# Patient Record
Sex: Male | Born: 2000 | Race: Black or African American | Hispanic: No | Marital: Single | Smoking: Never smoker
Health system: Southern US, Community
[De-identification: ages and names within clinical notes are randomized; demographics above are authoritative.]

## PROBLEM LIST (undated history)

## (undated) DIAGNOSIS — R569 Unspecified convulsions: Secondary | ICD-10-CM

## (undated) HISTORY — PX: ABDOMINAL SURGERY: SHX537

---
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
## 2018-05-15 ENCOUNTER — Encounter (HOSPITAL_BASED_OUTPATIENT_CLINIC_OR_DEPARTMENT_OTHER): Payer: Self-pay | Admitting: Emergency Medicine

## 2018-05-15 ENCOUNTER — Emergency Department (HOSPITAL_BASED_OUTPATIENT_CLINIC_OR_DEPARTMENT_OTHER)
Admission: EM | Admit: 2018-05-15 | Discharge: 2018-05-15 | Disposition: A | Discharge status: Home / Self Care | Payer: Self-pay | Attending: Emergency Medicine | Admitting: Emergency Medicine

## 2018-05-15 ENCOUNTER — Other Ambulatory Visit: Payer: Self-pay

## 2018-05-15 DIAGNOSIS — M79662 Pain in left lower leg: Secondary | ICD-10-CM | POA: Insufficient documentation

## 2018-05-15 DIAGNOSIS — M79605 Pain in left leg: Secondary | ICD-10-CM

## 2018-05-15 LAB — BASIC METABOLIC PANEL
Anion gap: 7 (ref 5–15)
BUN: 20 mg/dL (ref 6–20)
CHLORIDE: 106 mmol/L (ref 98–111)
CO2: 25 mmol/L (ref 22–32)
Calcium: 8.9 mg/dL (ref 8.9–10.3)
Creatinine, Ser: 0.93 mg/dL (ref 0.61–1.24)
GFR calc Af Amer: >60 mL/min (ref >60)
GFR calc non Af Amer: >60 mL/min (ref >60)
Glucose, Bld: 121 mg/dL — ABNORMAL HIGH (ref 70–99)
POTASSIUM: 4.1 mmol/L (ref 3.5–5.1)
SODIUM: 138 mmol/L (ref 135–145)

## 2018-05-15 LAB — CBC WITH DIFFERENTIAL/PLATELET
ABS IMMATURE GRANULOCYTES: 0.01 10*3/uL (ref 0.00–0.07)
Basophils Absolute: 0 10*3/uL (ref 0.0–0.1)
Basophils Relative: 0 %
Eosinophils Absolute: 0.2 10*3/uL (ref 0.0–0.5)
Eosinophils Relative: 5 %
HCT: 47.7 % (ref 39.0–52.0)
HEMOGLOBIN: 15.3 g/dL (ref 13.0–17.0)
Immature Granulocytes: 0 %
LYMPHS ABS: 2 10*3/uL (ref 0.7–4.0)
LYMPHS PCT: 44 %
MCH: 28.9 pg (ref 26.0–34.0)
MCHC: 32.1 g/dL (ref 30.0–36.0)
MCV: 90 fL (ref 80.0–100.0)
MONO ABS: 0.4 10*3/uL (ref 0.1–1.0)
Monocytes Relative: 8 %
NEUTROS ABS: 2 10*3/uL (ref 1.7–7.7)
Neutrophils Relative %: 43 %
Platelets: 250 10*3/uL (ref 150–400)
RBC: 5.3 MIL/uL (ref 4.22–5.81)
RDW: 14.2 % (ref 11.5–15.5)
WBC: 4.7 10*3/uL (ref 4.0–10.5)
nRBC: 0 % (ref 0.0–0.2)

## 2018-05-15 LAB — D-DIMER, QUANTITATIVE (NOT AT ARMC)

## 2018-05-15 MED ORDER — ACETAMINOPHEN 325 MG PO TABS
650.0000 mg | ORAL_TABLET | Freq: Once | ORAL | Status: AC
  Administered 2018-05-15: 650 mg via ORAL
  Filled 2018-05-15: qty 2

## 2018-05-15 NOTE — ED Notes (Signed)
Signature pad in room not working. Pt provided verbal understanding of discharge instructions with teach back.  

## 2018-05-15 NOTE — Discharge Instructions (Signed)
Report back to this ED tomorrow at 1 PM for your appointment for an ultrasound of your leg. You can take Tylenol before hours as needed for pain.

## 2018-05-15 NOTE — ED Notes (Signed)
Left lower leg pain  States woke w knot to back of leg  Denies inj

## 2018-05-15 NOTE — ED Triage Notes (Signed)
Patient states that he woke up this am with a "knot" in his left leg - patient states that it is still painful

## 2018-05-15 NOTE — ED Provider Notes (Signed)
[facility] EMERGENCY DEPARTMENT Provider Note   CSN: [id number] Arrival date & time: 05/15/18  1537     History   Chief Complaint Chief Complaint  Patient presents with  . Leg Pain    HPI Thomas Roy is a 17 y.o. male without significant past medical history, presenting to the emergency department with acute onset of left calf pain began upon awakening this morning.  He states he has a "knot" there that has gotten slightly smaller, however pain is still persists.  Pain is in the calf, worse with walking.  Applied heat and took some Tylenol without much relief.  Denies history of blood clot.  No recent prolonged travel, surgery, trauma.  No known injuries.  No shortness of breath.  The history is provided by the patient.    History reviewed. No pertinent past medical history.  There are no active problems to display for this patient.   Past Surgical History:  Procedure Laterality Date  . ABDOMINAL SURGERY          Home Medications    Prior to Admission medications   Not on File    Family History History reviewed. No pertinent family history.  Social History Social History   Tobacco Use  . Smoking status: Never Smoker  . Smokeless tobacco: Never Used  Substance Use Topics  . Alcohol use: No  . Drug use: No     Allergies   Patient has no known allergies.   Review of Systems Review of Systems  Musculoskeletal: Positive for myalgias.  Skin: Negative for color change and wound.  All other systems reviewed and are negative.    Physical Exam Updated Vital Signs BP 129/75 (BP Location: Left Arm)   Pulse 62   Temp 99 F (37.2 C) (Oral)   Resp 18   Ht 6\' 4"  (1.93 m)   Wt 104.3 kg   SpO2 100%   BMI 27.99 kg/m   Physical Exam  Constitutional: He appears well-developed and well-nourished. No distress.  HENT:  Head: Normocephalic and atraumatic.  Eyes: Conjunctivae are normal.  Cardiovascular: Normal rate and intact distal pulses.   Pulmonary/Chest: Effort normal.  Musculoskeletal:  Left calf feels firm compared to the right.  There is some tenderness to the middle of the calf.  No obvious palpable cord.  No redness or warmth.  Antalgic gait favoring the left.  Positive Homans sign.  Normal range of motion of surrounding joints.  Psychiatric: He has a normal mood and affect. His behavior is normal.  Nursing note and vitals reviewed.    ED Treatments / Results  Labs (all labs ordered are listed, but only abnormal results are displayed) Labs Reviewed  BASIC METABOLIC PANEL - Abnormal; Notable for the following components:      Result Value   Glucose, Bld 121 (*)    All other components within normal limits  CBC WITH DIFFERENTIAL/PLATELET  D-DIMER, QUANTITATIVE (NOT AT [facility])    EKG None  Radiology No results found.  Procedures Procedures (including critical care time)  Medications Ordered in ED Medications  acetaminophen (TYLENOL) tablet 650 mg (650 mg Oral Given 05/15/18 1659)     Initial Impression / Assessment and Plan / ED Course  I have reviewed the triage vital signs and the nursing notes.  Pertinent labs & imaging results that were available during my care of the patient were reviewed by me and considered in my medical decision making (see chart for details).     Patient presenting  with acute onset of left calf pain that began this morning upon awakening.  No particular injury.  Pain with walking.  No risk factors for DVT.  No respiratory symptoms.  Vital signs are normal.  On exam, he has a positive Homans sign with mid calf tenderness.  Calf does feel firm to the touch compared the left.  Antalgic gait favoring the left.  Neurovascularly intact.  Vascular ultrasound is not currently available at this time to rule out DVT.  For this reason, labs obtained, d-dimer is negative.  Patient will be discharged with appointment tomorrow afternoon at 1 PM for ultrasound to rule out DVT.  Agreeable to this  plan and safe for discharge.  Patient discussed with Dr. Fredderick Phenix.  Discussed results, findings, treatment and follow up. Patient advised of return precautions. Patient verbalized understanding and agreed with plan.  Final Clinical Impressions(s) / ED Diagnoses   Final diagnoses:  Pain of left calf    ED Discharge Orders         Ordered    US Venous Img Lower Unilateral Left     05/15/18 1748           Letrell Attwood, [place] N, PA-C 05/15/18 1759    Rolan Bucco, MD 05/15/18 [phone]

## 2018-05-16 ENCOUNTER — Inpatient Hospital Stay (HOSPITAL_BASED_OUTPATIENT_CLINIC_OR_DEPARTMENT_OTHER): Admit: 2018-05-16 | Payer: Self-pay

## 2018-05-17 ENCOUNTER — Ambulatory Visit (HOSPITAL_BASED_OUTPATIENT_CLINIC_OR_DEPARTMENT_OTHER): Admission: RE | Admit: 2018-05-17 | Payer: Self-pay | Source: Ambulatory Visit

## 2018-07-18 ENCOUNTER — Other Ambulatory Visit: Payer: Self-pay

## 2018-07-18 ENCOUNTER — Emergency Department (HOSPITAL_BASED_OUTPATIENT_CLINIC_OR_DEPARTMENT_OTHER)
Admission: EM | Admit: 2018-07-18 | Discharge: 2018-07-18 | Disposition: A | Discharge status: Home / Self Care | Payer: Self-pay | Attending: Emergency Medicine | Admitting: Emergency Medicine

## 2018-07-18 ENCOUNTER — Encounter (HOSPITAL_BASED_OUTPATIENT_CLINIC_OR_DEPARTMENT_OTHER): Payer: Self-pay | Admitting: Emergency Medicine

## 2018-07-18 DIAGNOSIS — B351 Tinea unguium: Secondary | ICD-10-CM | POA: Insufficient documentation

## 2018-07-18 NOTE — ED Triage Notes (Signed)
Patient states that he his left big toe toenail is " lifted up" -

## 2018-07-18 NOTE — Discharge Instructions (Signed)
Please follow-up with the podiatrist for further evaluation and treatment of your fungal nail infection.  You may need an oral medication that requires your blood levels to be monitored.  Keep your nail attached as long as possible to protect the new nail that is growing underneath.  Please return to the emergency department you develop any pain, redness, swelling, red streaking from the area.

## 2018-07-18 NOTE — ED Provider Notes (Signed)
[facility] EMERGENCY DEPARTMENT Provider Note   CSN: [id number] Arrival date & time: 07/18/18  1641     History   Chief Complaint Chief Complaint  Patient presents with  . Nail Problem    HPI Thomas Roy is a 18 y.o. male who presents with concern that his left toenail is lifting up.  He reports he noticed it in the shower today.  He denies any trauma.  He denies any significant pain unless he messes with it.  He denies having problems with the nail before.  No interventions taken prior to arrival.  HPI  History reviewed. No pertinent past medical history.  There are no active problems to display for this patient.   Past Surgical History:  Procedure Laterality Date  . ABDOMINAL SURGERY          Home Medications    Prior to Admission medications   Not on File    Family History History reviewed. No pertinent family history.  Social History Social History   Tobacco Use  . Smoking status: Never Smoker  . Smokeless tobacco: Never Used  Substance Use Topics  . Alcohol use: No  . Drug use: No     Allergies   Patient has no known allergies.   Review of Systems Review of Systems  Constitutional: Negative for fever.  Skin: Negative for rash and wound.     Physical Exam Updated Vital Signs BP 123/78 (BP Location: Left Arm)   Pulse 88   Temp 98.5 F (36.9 C) (Oral)   Resp 18   Ht 6\' 4"  (1.93 m)   Wt 104.3 kg   SpO2 100%   BMI 27.99 kg/m   Physical Exam Vitals signs and nursing note reviewed.  Constitutional:      General: He is not in acute distress.    Appearance: He is well-developed. He is not diaphoretic.  HENT:     Head: Normocephalic and atraumatic.     Mouth/Throat:     Pharynx: No oropharyngeal exudate.  Eyes:     General: No scleral icterus.       Right eye: No discharge.        Left eye: No discharge.     Conjunctiva/sclera: Conjunctivae normal.     Pupils: Pupils are equal, round, and reactive to light.  Neck:       Musculoskeletal: Normal range of motion and neck supple.     Thyroid: No thyromegaly.  Cardiovascular:     Rate and Rhythm: Normal rate and regular rhythm.     Heart sounds: Normal heart sounds. No murmur. No friction rub. No gallop.   Pulmonary:     Effort: Pulmonary effort is normal. No respiratory distress.     Breath sounds: Normal breath sounds. No stridor. No wheezing or rales.  Abdominal:     General: Bowel sounds are normal. There is no distension.     Palpations: Abdomen is soft.     Tenderness: There is no abdominal tenderness. There is no guarding or rebound.  Lymphadenopathy:     Cervical: No cervical adenopathy.  Skin:    General: Skin is warm and dry.     Coloration: Skin is not pale.     Findings: No rash.     Comments: Left great toenail is lifting up on the lateral aspect and is still attached on the medial aspect; it is thick and fungal appearing; there is a new, also fungal appearing, toenail growing underneath, and there is no surrounding  erythema or drainage  Neurological:     Mental Status: He is alert.     Coordination: Coordination normal.      ED Treatments / Results  Labs (all labs ordered are listed, but only abnormal results are displayed) Labs Reviewed - No data to display  EKG None  Radiology No results found.  Procedures Procedures (including critical care time)  Medications Ordered in ED Medications - No data to display   Initial Impression / Assessment and Plan / ED Course  I have reviewed the triage vital signs and the nursing notes.  Pertinent labs & imaging results that were available during my care of the patient were reviewed by me and considered in my medical decision making (see chart for details).     Patient with onychomycosis.  He appears to have a new, also fungal toenail growing underneath. The fungal infection seems to be extended to the to the nail matrix and I do not feel an expensive, topical medication is  worthwhile at this point.  I do not feel comfortable initiating terbinafine without monitoring.  Patient will be referred to podiatry for further evaluation.  Patient vies to keep nail in place as long as possible.  Return precautions discussed.  Patient understands and agrees with plan.  Patient vital stable throughout ED course and discharged in satisfactory condition.  Final Clinical Impressions(s) / ED Diagnoses   Final diagnoses:  Onychomycosis of left great toe    ED Discharge Orders    None       Emi Holes, PA-C 07/18/18 Clyde Lundborg, MD 07/18/18 [phone]

## 2018-07-30 ENCOUNTER — Ambulatory Visit (INDEPENDENT_AMBULATORY_CARE_PROVIDER_SITE_OTHER): Payer: Self-pay | Admitting: Family Medicine

## 2018-07-30 ENCOUNTER — Encounter: Payer: Self-pay | Admitting: Family Medicine

## 2018-07-30 VITALS — BP 108/88 | HR 74 | Temp 98.4°F | Ht 76.0 in | Wt 233.0 lb

## 2018-07-30 DIAGNOSIS — Z09 Encounter for follow-up examination after completed treatment for conditions other than malignant neoplasm: Secondary | ICD-10-CM

## 2018-07-30 DIAGNOSIS — Z7689 Persons encountering health services in other specified circumstances: Secondary | ICD-10-CM

## 2018-07-30 DIAGNOSIS — B351 Tinea unguium: Secondary | ICD-10-CM

## 2018-07-30 DIAGNOSIS — L03032 Cellulitis of left toe: Secondary | ICD-10-CM

## 2018-07-30 DIAGNOSIS — Z0000 Encounter for general adult medical examination without abnormal findings: Secondary | ICD-10-CM

## 2018-07-30 LAB — POCT URINALYSIS DIP (MANUAL ENTRY)
Bilirubin, UA: NEGATIVE
Glucose, UA: NEGATIVE mg/dL
Ketones, POC UA: NEGATIVE mg/dL
Leukocytes, UA: NEGATIVE
Nitrite, UA: NEGATIVE
Protein Ur, POC: NEGATIVE mg/dL
Spec Grav, UA: 1.02 (ref 1.010–1.025)
Urobilinogen, UA: 1 E.U./dL
pH, UA: 6 (ref 5.0–8.0)

## 2018-07-30 MED ORDER — CEPHALEXIN 500 MG PO CAPS: 500.0000 mg | ORAL_CAPSULE | Freq: Three times a day (TID) | ORAL | 0 refills | Status: AC

## 2018-07-30 NOTE — Patient Instructions (Signed)
Ingrown Toenail  An ingrown toenail occurs when the corner or sides of a toenail grow into the surrounding skin. This causes discomfort and pain. The big toe is most commonly affected, but any of the toes can be affected. If an ingrown toenail is not treated, it can become infected.  What are the causes?  This condition may be caused by:  · Wearing shoes that are too small or tight.  · An injury, such as stubbing your toe or having your toe stepped on.  · Improper cutting or care of your toenails.  · Having nail or foot abnormalities that were present from birth (congenital abnormalities), such as having a nail that is too big for your toe.  What increases the risk?  The following factors may make you more likely to develop ingrown toenails:  · Age. Nails tend to get thicker with age, so ingrown nails are more common among older people.  · Cutting your toenails incorrectly, such as cutting them very short or cutting them unevenly.  An ingrown toenail is more likely to get infected if you have:  · Diabetes.  · Blood flow (circulation) problems.  What are the signs or symptoms?  Symptoms of an ingrown toenail may include:  · Pain, soreness, or tenderness.  · Redness.  · Swelling.  · Hardening of the skin that surrounds the toenail.  Signs that an ingrown toenail may be infected include:  · Fluid or pus.  · Symptoms that get worse instead of better.  How is this diagnosed?  An ingrown toenail may be diagnosed based on your medical history, your symptoms, and a physical exam. If you have fluid or blood coming from your toenail, a sample may be collected to test for the specific type of bacteria that is causing the infection.  How is this treated?  Treatment depends on how severe your ingrown toenail is. You may be able to care for your toenail at home.  · If you have an infection, you may be prescribed antibiotic medicines.  · If you have fluid or pus draining from your toenail, your health care provider may drain  it.  · If you have trouble walking, you may be given crutches to use.  · If you have a severe or infected ingrown toenail, you may need a procedure to remove part or all of the nail.  Follow these instructions at home:  Foot care    · Do not pick at your toenail or try to remove it yourself.  · Soak your foot in warm, soapy water. Do this for 20 minutes, 3 times a day, or as often as told by your health care provider. This helps to keep your toe clean and keep your skin soft.  · Wear shoes that fit well and are not too tight. Your health care provider may recommend that you wear open-toed shoes while you heal.  · Trim your toenails regularly and carefully. Cut your toenails straight across to prevent injury to the skin at the corners of the toenail. Do not cut your nails in a curved shape.  · Keep your feet clean and dry to help prevent infection.  Medicines  · Take over-the-counter and prescription medicines only as told by your health care provider.  · If you were prescribed an antibiotic, take it as told by your health care provider. Do not stop taking the antibiotic even if you start to feel better.  Activity  · Return to your normal activities   as told by your health care provider. Ask your health care provider what activities are safe for you.  · Avoid activities that cause pain.  General instructions  · If your health care provider told you to use crutches to help you move around, use them as instructed.  · Keep all follow-up visits as told by your health care provider. This is important.  Contact a health care provider if:  · You have more redness, swelling, pain, or other symptoms that do not improve with treatment.  · You have fluid, blood, or pus coming from your toenail.  Get help right away if:  · You have a red streak on your skin that starts at your foot and spreads up your leg.  · You have a fever.  Summary  · An ingrown toenail occurs when the corner or sides of a toenail grow into the surrounding  skin. This causes discomfort and pain. The big toe is most commonly affected, but any of the toes can be affected.  · If an ingrown toenail is not treated, it can become infected.  · Fluid or pus draining from your toenail is a sign of infection. Your health care provider may need to drain it. You may be given antibiotics to treat the infection.  · Trimming your toenails regularly and properly can help you prevent an ingrown toenail.  This information is not intended to replace advice given to you by your health care provider. Make sure you discuss any questions you have with your health care provider.  Document Released: 05/30/2000 Document Revised: 02/18/2017 Document Reviewed: 02/18/2017  Elsevier Interactive Patient Education © 2019 Elsevier Inc.    Cephalexin tablets or capsules  What is this medicine?  CEPHALEXIN (sef a LEX in) is a cephalosporin antibiotic. It is used to treat certain kinds of bacterial infections It will not work for colds, flu, or other viral infections.  This medicine may be used for other purposes; ask your health care provider or pharmacist if you have questions.  COMMON BRAND NAME(S): Biocef, Daxbia, Keflex, Keftab  What should I tell my health care provider before I take this medicine?  They need to know if you have any of these conditions:  -kidney disease  -stomach or intestine problems, especially colitis  -an unusual or allergic reaction to cephalexin, other cephalosporins, penicillins, other antibiotics, medicines, foods, dyes or preservatives  -pregnant or trying to get pregnant  -breast-feeding  How should I use this medicine?  Take this medicine by mouth with a full glass of water. Follow the directions on the prescription label. This medicine can be taken with or without food. Take your medicine at regular intervals. Do not take your medicine more often than directed. Take all of your medicine as directed even if you think you are better. Do not skip doses or stop your  medicine early.  Talk to your pediatrician regarding the use of this medicine in children. While this drug may be prescribed for selected conditions, precautions do apply.  Overdosage: If you think you have taken too much of this medicine contact a poison control center or emergency room at once.  NOTE: This medicine is only for you. Do not share this medicine with others.  What if I miss a dose?  If you miss a dose, take it as soon as you can. If it is almost time for your next dose, take only that dose. Do not take double or extra doses. There should be at least   4 to 6 hours between doses.  What may interact with this medicine?  -probenecid  -some other antibiotics  This list may not describe all possible interactions. Give your health care provider a list of all the medicines, herbs, non-prescription drugs, or dietary supplements you use. Also tell them if you smoke, drink alcohol, or use illegal drugs. Some items may interact with your medicine.  What should I watch for while using this medicine?  Tell your doctor or health care professional if your symptoms do not begin to improve in a few days.  Do not treat diarrhea with over the counter products. Contact your doctor if you have diarrhea that lasts more than 2 days or if it is severe and watery.  If you have diabetes, you may get a false-positive result for sugar in your urine. Check with your doctor or health care professional.  What side effects may I notice from receiving this medicine?  Side effects that you should report to your doctor or health care professional as soon as possible:  -allergic reactions like skin rash, itching or hives, swelling of the face, lips, or tongue  -breathing problems  -pain or trouble passing urine  -redness, blistering, peeling or loosening of the skin, including inside the mouth  -severe or watery diarrhea  -unusually weak or tired  -yellowing of the eyes, skin  Side effects that usually do not require medical attention  (report to your doctor or health care professional if they continue or are bothersome):  -gas or heartburn  -genital or anal irritation  -headache  -joint or muscle pain  -nausea, vomiting  This list may not describe all possible side effects. Call your doctor for medical advice about side effects. You may report side effects to FDA at 1-800-FDA-1088.  Where should I keep my medicine?  Keep out of the reach of children.  Store at room temperature between 59 and 86 degrees F (15 and 30 degrees C). Throw away any unused medicine after the expiration date.  NOTE: This sheet is a summary. It may not cover all possible information. If you have questions about this medicine, talk to your doctor, pharmacist, or health care provider.  © 2019 Elsevier/Gold Standard (2007-09-06 17:09:13)

## 2018-07-30 NOTE — Progress Notes (Signed)
[facility]  New Patient--Hospital Follow Up  Subjective:  Patient ID: Thomas RazorDavonte Febles, male    DOB: 2001/03/06  Age: 18 y.o. MRN: [id number]  CC:  Chief Complaint  Patient presents with  . Establish Care    left foot infection     HPI Thomas Roy is a 18 year old male who presents to Hospital Follow Up and to Establish Care.   No past medical history on file.  Current Status: Since his last office visit, he he states that his left great toenail is about to fall off.      He denies fevers, chills, fatigue, recent infections, weight loss, and night sweats. He has not had any headaches, visual changes, dizziness, and falls. No chest pain, heart palpitations, cough and shortness of breath reported. No reports of GI problems such as nausea, vomiting, diarrhea, and constipation. He has no reports of blood in stools, dysuria and hematuria. No depression or anxiety reported. He denies pain today.    Past Surgical History:  Procedure Laterality Date  . ABDOMINAL SURGERY      Family History  Problem Relation Age of Onset  . Other Mother        Healthy    Social History   Socioeconomic History  . Marital status: Single    Spouse name: Not on file  . Number of children: Not on file  . Years of education: Not on file  . Highest education level: Not on file  Occupational History  . Not on file  Social Needs  . Financial resource strain: Not on file  . Food insecurity:    Worry: Not on file    Inability: Not on file  . Transportation needs:    Medical: Not on file    Non-medical: Not on file  Tobacco Use  . Smoking status: Never Smoker  . Smokeless tobacco: Never Used  Substance and Sexual Activity  . Alcohol use: No  . Drug use: No  . Sexual activity: Not on file  Lifestyle  . Physical activity:    Days per week: Not on file    Minutes per session: Not on file  . Stress: Not on file  Relationships  . Social  connections:    Talks on phone: Not on file    Gets together: Not on file    Attends religious service: Not on file    Active member of club or organization: Not on file    Attends meetings of clubs or organizations: Not on file    Relationship status: Not on file  . Intimate partner violence:    Fear of current or ex partner: Not on file    Emotionally abused: Not on file    Physically abused: Not on file    Forced sexual activity: Not on file  Other Topics Concern  . Not on file  Social History Narrative  . Not on file    No outpatient medications prior to visit.   No facility-administered medications prior to visit.     Allergies  Allergen Reactions  . Coconut Oil Dermatitis    ROS Review of Systems  Constitutional: Negative.   HENT: Negative.   Eyes: Negative.   Respiratory: Negative.   Cardiovascular: Negative.   Gastrointestinal: Negative.   Endocrine: Negative.   Genitourinary: Negative.   Musculoskeletal: Negative.   Skin:       Left great toe fungus  Allergic/Immunologic: Negative.   Neurological: Negative.  Hematological: Negative.   Psychiatric/Behavioral: Negative.    Objective:   BP 108/88 (BP Location: Right Arm, Patient Position: Sitting, Cuff Size: Large)   Pulse 74   Temp 98.4 F (36.9 C) (Oral)   Ht 6\' 4"  (1.93 m)   Wt 233 lb (105.7 kg)   SpO2 100%   BMI 28.36 kg/m  Wt Readings from Last 3 Encounters:  07/30/18 233 lb (105.7 kg) (98 %, Z= 2.12)*  07/18/18 229 lb 15 oz (104.3 kg) (98 %, Z= 2.07)*  05/15/18 229 lb 15 oz (104.3 kg) (98 %, Z= 2.08)*   * Growth percentiles are based on CDC (Boys, 2-20 Years) data.    Health Maintenance Due  Topic Date Due  . HIV Screening  12/12/2013  . INFLUENZA VACCINE  01/14/2018    There are no preventive care reminders to display for this patient.  No results found for: TSH Lab Results  Component Value Date   WBC 4.3 07/30/2018   HGB 16.2 07/30/2018   HCT 47.4 07/30/2018   MCV 86  07/30/2018   PLT 254 07/30/2018   Lab Results  Component Value Date   NA 141 07/30/2018   K 4.3 07/30/2018   CO2 22 07/30/2018   GLUCOSE 88 07/30/2018   BUN 13 07/30/2018   CREATININE 0.89 07/30/2018   BILITOT 0.4 07/30/2018   ALKPHOS 79 07/30/2018   AST 12 07/30/2018   ALT 14 07/30/2018   PROT 7.1 07/30/2018   ALBUMIN 4.7 07/30/2018   CALCIUM 9.6 07/30/2018   ANIONGAP 7 05/15/2018   No results found for: CHOL No results found for: HDL No results found for: LDLCALC No results found for: TRIG No results found for: CHOLHDL No results found for: VZSM2L    Assessment & Plan:   1. Hospital discharge follow-up  2. Encounter to establish care - POCT urinalysis dipstick  3. Fungal toenail infection We will initiate Keflex today.  - cephALEXin (KEFLEX) 500 MG capsule; Take 1 capsule (500 mg total) by mouth 3 (three) times daily for 10 days. For toenail infection  Dispense: 30 capsule; Refill: 0  4. Paronychia of toenail, left     5. Healthcare maintenance - CBC with Differential - Comprehensive metabolic panel  6. Follow up He will follow up in 3 months.   Meds ordered this encounter  Medications  . cephALEXin (KEFLEX) 500 MG capsule    Sig: Take 1 capsule (500 mg total) by mouth 3 (three) times daily for 10 days. For toenail infection    Dispense:  30 capsule    Refill:  0    Orders Placed This Encounter  Procedures  . CBC with Differential  . Comprehensive metabolic panel  . POCT urinalysis dipstick   Referral Orders  No referral(s) requested today    Problem List Items Addressed This Visit    None    Visit Diagnoses    Hospital discharge follow-up    -  Primary   Encounter to establish care       Relevant Orders   POCT urinalysis dipstick (Completed)   Fungal toenail infection       Relevant Medications   cephALEXin (KEFLEX) 500 MG capsule   Paronychia of toenail, left       Relevant Medications   cephALEXin (KEFLEX) 500 MG capsule    Healthcare maintenance       Relevant Orders   CBC with Differential (Completed)   Comprehensive metabolic panel (Completed)   Follow up  Meds ordered this encounter  Medications  . cephALEXin (KEFLEX) 500 MG capsule    Sig: Take 1 capsule (500 mg total) by mouth 3 (three) times daily for 10 days. For toenail infection    Dispense:  30 capsule    Refill:  0    Follow-up: Return in about 3 months (around 10/28/2018).    Kallie Locks, FNP

## 2018-07-31 LAB — COMPREHENSIVE METABOLIC PANEL
ALT: 14 IU/L (ref 0–44)
AST: 12 IU/L (ref 0–40)
Albumin/Globulin Ratio: 2 (ref 1.2–2.2)
Albumin: 4.7 g/dL (ref 4.1–5.2)
Alkaline Phosphatase: 79 IU/L (ref 39–117)
BUN/Creatinine Ratio: 15 (ref 9–20)
BUN: 13 mg/dL (ref 6–20)
Bilirubin Total: 0.4 mg/dL (ref 0.0–1.2)
CO2: 22 mmol/L (ref 20–29)
Calcium: 9.6 mg/dL (ref 8.7–10.2)
Chloride: 103 mmol/L (ref 96–106)
Creatinine, Ser: 0.89 mg/dL (ref 0.76–1.27)
GFR calc Af Amer: 143 mL/min/{1.73_m2} (ref >59)
GFR calc non Af Amer: 124 mL/min/{1.73_m2} (ref >59)
Globulin, Total: 2.4 g/dL (ref 1.5–4.5)
Glucose: 88 mg/dL (ref 65–99)
Potassium: 4.3 mmol/L (ref 3.5–5.2)
Sodium: 141 mmol/L (ref 134–144)
Total Protein: 7.1 g/dL (ref 6.0–8.5)

## 2018-07-31 LAB — CBC WITH DIFFERENTIAL/PLATELET
Basophils Absolute: 0 10*3/uL (ref 0.0–0.2)
Basos: 1 %
EOS (ABSOLUTE): 0.1 10*3/uL (ref 0.0–0.4)
Eos: 2 %
Hematocrit: 47.4 % (ref 37.5–51.0)
Hemoglobin: 16.2 g/dL (ref 13.0–17.7)
Immature Grans (Abs): 0 10*3/uL (ref 0.0–0.1)
Immature Granulocytes: 0 %
Lymphocytes Absolute: 1.7 10*3/uL (ref 0.7–3.1)
Lymphs: 39 %
MCH: 29.5 pg (ref 26.6–33.0)
MCHC: 34.2 g/dL (ref 31.5–35.7)
MCV: 86 fL (ref 79–97)
Monocytes Absolute: 0.4 10*3/uL (ref 0.1–0.9)
Monocytes: 9 %
Neutrophils Absolute: 2.1 10*3/uL (ref 1.4–7.0)
Neutrophils: 49 %
Platelets: 254 10*3/uL (ref 150–450)
RBC: 5.49 x10E6/uL (ref 4.14–5.80)
RDW: 13.6 % (ref 11.6–15.4)
WBC: 4.3 10*3/uL (ref 3.4–10.8)

## 2018-08-03 DIAGNOSIS — L03032 Cellulitis of left toe: Secondary | ICD-10-CM | POA: Insufficient documentation

## 2018-08-03 DIAGNOSIS — B351 Tinea unguium: Secondary | ICD-10-CM | POA: Insufficient documentation

## 2018-10-07 ENCOUNTER — Encounter (HOSPITAL_BASED_OUTPATIENT_CLINIC_OR_DEPARTMENT_OTHER): Payer: Self-pay | Admitting: Emergency Medicine

## 2018-10-07 ENCOUNTER — Emergency Department (HOSPITAL_BASED_OUTPATIENT_CLINIC_OR_DEPARTMENT_OTHER)
Admission: EM | Admit: 2018-10-07 | Discharge: 2018-10-07 | Disposition: A | Discharge status: Home / Self Care | Payer: Self-pay | Attending: Emergency Medicine | Admitting: Emergency Medicine

## 2018-10-07 ENCOUNTER — Other Ambulatory Visit: Payer: Self-pay

## 2018-10-07 DIAGNOSIS — W228XXD Striking against or struck by other objects, subsequent encounter: Secondary | ICD-10-CM | POA: Insufficient documentation

## 2018-10-07 DIAGNOSIS — S0181XD Laceration without foreign body of other part of head, subsequent encounter: Secondary | ICD-10-CM | POA: Insufficient documentation

## 2018-10-07 DIAGNOSIS — Z4802 Encounter for removal of sutures: Secondary | ICD-10-CM | POA: Insufficient documentation

## 2018-10-07 DIAGNOSIS — Z5189 Encounter for other specified aftercare: Secondary | ICD-10-CM

## 2018-10-07 NOTE — Discharge Instructions (Signed)
Please read and follow all provided instructions.  Your diagnoses today incluTests performed today de:  1. Visit for suture removal   2. Encounter for wound re-check     include:  Vital signs. See below for your results today.   Medications prescribed:   None  Take any prescribed medications only as directed.   Home care instructions:  Follow any educational materials and wound care instructions contained in this packet.   Gently clean the wound 3 times a day with mild soap and water.  Gently dab dry afterwards.  You may use a topical cream such as mederma or something similar to help minimize scarring.   Return instructions:  Return to the Emergency Department if you have:  Fever  Worsening pain  Worsening swelling of the wound  Pus draining from the wound  Redness of the skin that moves away from the wound, especially if it streaks away from the affected area   Any other emergent concerns  Your vital signs today were: BP 140/81 (BP Location: Right Arm)    Pulse 70    Temp 98.2 F (36.8 C) (Oral)    Resp 14    Ht 6\' 4"  (1.93 m)    Wt 104.3 kg    SpO2 100%    BMI 28.00 kg/m  If your blood pressure (BP) was elevated above 135/85 this visit, please have this repeated by your doctor within one month. --------------

## 2018-10-07 NOTE — ED Provider Notes (Signed)
[facility] EMERGENCY DEPARTMENT Provider Note   CSN: [id number] Arrival date & time: 10/07/18  1159    History   Chief Complaint Chief Complaint  Patient presents with  . Suture / Staple Removal    HPI Thomas Roy is a 18 y.o. male.     Patient presents emergency department for suture removal.  Patient states that he was struck with a box while he was working 1 week ago in the right forehead area.  He sustained a large laceration that was closed with 17 Prolene sutures.  Patient also states that he had imaging at that time which was negative.  [facility]  Patient states that the area has been itchy but not overly painful.  No drainage.  He has not had any residual headaches, nausea, vomiting, concentration difficulties, trouble walking, or other symptoms concerning for concussion.  States that he was told to come to the sutures out 5 to 7 days after placement.     History reviewed. No pertinent past medical history.  Patient Active Problem List   Diagnosis Date Noted  . Fungal toenail infection 08/03/2018  . Paronychia of toenail, left 08/03/2018    Past Surgical History:  Procedure Laterality Date  . ABDOMINAL SURGERY          Home Medications    Prior to Admission medications   Not on File    Family History Family History  Problem Relation Age of Onset  . Other Mother        Healthy    Social History Social History   Tobacco Use  . Smoking status: Never Smoker  . Smokeless tobacco: Never Used  Substance Use Topics  . Alcohol use: No  . Drug use: No     Allergies   Coconut oil   Review of Systems Review of Systems  Eyes: Negative for visual disturbance.  Gastrointestinal: Negative for nausea and vomiting.  Musculoskeletal: Negative for gait problem, neck pain and neck stiffness.  Skin: Positive for wound.  Neurological: Negative for syncope, speech difficulty, weakness, light-headedness, numbness  and headaches.  Psychiatric/Behavioral: Negative for confusion and decreased concentration.     Physical Exam Updated Vital Signs BP 140/81 (BP Location: Right Arm)   Pulse 70   Temp 98.2 F (36.8 C) (Oral)   Resp 14   Ht 6\' 4"  (1.93 m)   Wt 104.3 kg   SpO2 100%   BMI 28.00 kg/m   Physical Exam Vitals signs and nursing note reviewed.  Constitutional:      Appearance: He is well-developed.  HENT:     Head: Normocephalic.     Comments: Patient with approximately 7 cm flap laceration to the right forehead with good adherence.  Scabbing noted across the entirety of wound.  Most of the sutures are at least partially covered with the scabbing.  There is imperfect wound approximation to the superior aspect of the wound and some gaping noted to the inferior aspect once the scabbing was removed.  No active drainage.  No signs of infection. Eyes:     Conjunctiva/sclera: Conjunctivae normal.  Neck:     Musculoskeletal: Normal range of motion and neck supple.  Pulmonary:     Effort: No respiratory distress.  Skin:    General: Skin is warm and dry.  Neurological:     Mental Status: He is alert.      ED Treatments / Results  Labs (all labs ordered are listed, but only abnormal  results are displayed) Labs Reviewed - No data to display  EKG None  Radiology No results found.  Procedures .Suture Removal Date/Time: 10/07/2018 12:53 PM Performed by: Renne Crigler, PA-C Authorized by: Renne Crigler, PA-C   Consent:    Consent obtained:  Verbal   Consent given by:  Patient Location:    Location:  Head/neck   Head/neck location:  Forehead Procedure details:    Wound appearance:  No signs of infection, good wound healing and clean   Number of sutures removed:  17 Post-procedure details:    Post-removal:  Steri-Strips applied and no dressing applied   Patient tolerance of procedure:  Tolerated well, no immediate complications Comments:     At least half of the sutures  were brought into the tissue and needed gentle manipulation with an alcohol swab in order to mobilize the scabbing to make the sutures accessible.  There is a mild amount of bleeding due to the skin manipulation.  2 Steri-Strips were placed at completion of removal to approximate the inferior aspect of the wound as much as possible.   (including critical care time)  Medications Ordered in ED Medications - No data to display   Initial Impression / Assessment and Plan / ED Course  I have reviewed the triage vital signs and the nursing notes.  Pertinent labs & imaging results that were available during my care of the patient were reviewed by me and considered in my medical decision making (see chart for details).        Patient seen and examined.  Sutures removed as above.  Patient counseled to continue good wound care at home and use of topical creams to minimize scarring.  Discussed typical course of skin and wound healing after an injury like this.  Vital signs reviewed and are as follows: BP 140/81 (BP Location: Right Arm)   Pulse 70   Temp 98.2 F (36.8 C) (Oral)   Resp 14   Ht 6\' 4"  (1.93 m)   Wt 104.3 kg   SpO2 100%   BMI 28.00 kg/m   Pt urged to return with worsening pain, worsening swelling, expanding area of redness or streaking up extremity, fever, or any other concerns. Pt verbalizes understanding and agrees with plan.   Final Clinical Impressions(s) / ED Diagnoses   Final diagnoses:  Visit for suture removal  Encounter for wound re-check   Sutures removed without significant complication.  No residual symptoms of concussion.  ED Discharge Orders    None       Renne Crigler, Cordelia Poche 10/07/18 1255    Jacalyn Lefevre, MD 10/07/18 1326

## 2018-10-07 NOTE — ED Triage Notes (Signed)
Here for suture removal; sts sutures placed 4/16

## 2018-10-29 ENCOUNTER — Ambulatory Visit: Payer: Self-pay | Admitting: Family Medicine

## 2018-11-30 ENCOUNTER — Ambulatory Visit: Payer: Self-pay | Admitting: Family Medicine

## 2018-12-04 ENCOUNTER — Other Ambulatory Visit: Payer: Self-pay

## 2018-12-04 ENCOUNTER — Emergency Department (HOSPITAL_BASED_OUTPATIENT_CLINIC_OR_DEPARTMENT_OTHER)
Admission: EM | Admit: 2018-12-04 | Discharge: 2018-12-05 | Disposition: A | Discharge status: Home / Self Care | Payer: HRSA Program | Attending: Emergency Medicine | Admitting: Emergency Medicine

## 2018-12-04 ENCOUNTER — Encounter (HOSPITAL_BASED_OUTPATIENT_CLINIC_OR_DEPARTMENT_OTHER): Payer: Self-pay | Admitting: *Deleted

## 2018-12-04 DIAGNOSIS — U071 COVID-19: Secondary | ICD-10-CM | POA: Insufficient documentation

## 2018-12-04 DIAGNOSIS — M549 Dorsalgia, unspecified: Secondary | ICD-10-CM | POA: Diagnosis present

## 2018-12-04 LAB — SARS CORONAVIRUS 2 AG (30 MIN TAT): SARS Coronavirus 2 Ag: POSITIVE — AB

## 2018-12-04 MED ORDER — KETOROLAC TROMETHAMINE 15 MG/ML IJ SOLN: 30.0000 mg | Freq: Once | INTRAMUSCULAR | Status: DC

## 2018-12-04 MED ORDER — KETOROLAC TROMETHAMINE 30 MG/ML IJ SOLN
INTRAMUSCULAR | Status: AC
  Filled 2018-12-04: qty 1

## 2018-12-04 NOTE — ED Provider Notes (Signed)
[facility] Provider Note: Georgena Spurling, MD, FACEP  CSN: [id number] MRN: [id number] ARRIVAL: 12/04/18 at 2203 ROOM: [place]  Headache and Back Pain   HISTORY OF PRESENT ILLNESS  12/04/18 11:22 PM Thomas Roy is a 18 y.o. male whose mother is currently hospitalized for COVID-19.  He is here with mid back pain for over a week.  The back pain is not worse with deep breathing or movement.  He is also here with a headache for the past 2 days.  The headache is generalized and he rates the pain as a 6 out of 10.  He denies associated nausea or vomiting and has minimal photophobia.  He has no history of migraines.  He took 400 mg of ibuprofen about 2 PM without relief.  He denies fever, chills, shortness of breath, or cough.   History reviewed. No pertinent past medical history.  Past Surgical History:  Procedure Laterality Date  . ABDOMINAL SURGERY      Family History  Problem Relation Age of Onset  . Other Mother        Healthy    Social History   Tobacco Use  . Smoking status: Never Smoker  . Smokeless tobacco: Never Used  Substance Use Topics  . Alcohol use: No  . Drug use: No    Prior to Admission medications   Not on File    Allergies Coconut oil   REVIEW OF SYSTEMS  Negative except as noted here or in the History of Present Illness.   PHYSICAL EXAMINATION  Initial Vital Signs Blood pressure 129/76, pulse 62, temperature 98.8 F (37.1 C), temperature source Oral, resp. rate 18, height 6\' 4"  (1.93 m), weight 104.3 kg, SpO2 98 %.  Examination General: Well-developed, well-nourished male in no acute distress; appearance consistent with age of record HENT: normocephalic; atraumatic Eyes: pupils equal, round and reactive to light; extraocular muscles intact Neck: supple Heart: regular rate and rhythm Lungs: Normal respiratory effort and excursion Abdomen: soft; nondistended; nontender; no masses or hepatosplenomegaly;  bowel sounds present Back: Nontender Extremities: No deformity; full range of motion Neurologic: Awake, alert and oriented; motor function intact in all extremities and symmetric; no facial droop Skin: Warm and dry Psychiatric: Normal mood and affect   RESULTS  Summary of this visit's results, reviewed by myself:   EKG Interpretation  Date/Time:    Ventricular Rate:    PR Interval:    QRS Duration:   QT Interval:    QTC Calculation:   R Axis:     Text Interpretation:        Laboratory Studies: Results for orders placed or performed during the hospital encounter of 12/04/18 (from the past 24 hour(s))  SARS Coronavirus 2 (Hosp order,Performed in [place] lab via [employer] [facility])     Status: Abnormal   Collection Time: 12/04/18 10:47 PM   Specimen: Dry Nasal Swab ([facility] Now)  Result Value Ref Range   SARS Coronavirus 2 ([facility] Now) POSITIVE (A) NEGATIVE   Imaging Studies: No results found.  ED COURSE and MDM  Nursing notes and initial vitals signs, including pulse oximetry, reviewed.  Vitals:   12/04/18 2208 12/04/18 2214 12/04/18 2220  BP: 129/76    Pulse: 62    Resp: 18    Temp: 98.8 F (37.1 C)  99 F (37.2 C)  TempSrc: Oral  Oral  SpO2: 98%    Weight:  104.3 kg   Height:  6\' 4"  (1.93 m)  1:01 AM Patient's headache down to a 2 out of 10 after IM Toradol.  Patient advised that he is positive for COVID-19 and will need to be on self quarantine.  We will provide COVID-19 instructions.  Thomas Roy was evaluated in Emergency Department on 12/05/2018 for the symptoms described in the history of present illness. He was evaluated in the context of the global COVID-19 pandemic, which necessitated consideration that the patient might be at risk for infection with the SARS-CoV-2 virus that causes COVID-19. Institutional protocols and algorithms that pertain to the evaluation of patients at risk for COVID-19 are in a state of rapid change based on information  released by regulatory bodies including the CDC and federal and state organizations. These policies and algorithms were followed during the patient's care in the ED.  PROCEDURES    ED DIAGNOSES     ICD-10-CM   1. COVID-19 virus infection  U07.1        Tanyiah Laurich, MD 12/05/18 [id number]

## 2018-12-04 NOTE — ED Triage Notes (Signed)
Pt reports mid back pain  Since mid last week. Denies injury. Reports pain is worse with certain movements. C/o "migraine" headache x approx 3 days. Denies n/v. States he has never been dx with [employer]

## 2018-12-04 NOTE — ED Notes (Signed)
ED Provider at bedside. 

## 2018-12-04 NOTE — ED Notes (Signed)
Pts mother is confirmed Covid positive. Pt placed on precautions.

## 2018-12-05 MED ORDER — KETOROLAC TROMETHAMINE 30 MG/ML IJ SOLN
30.0000 mg | Freq: Once | INTRAMUSCULAR | Status: AC
  Administered 2018-12-05: 30 mg via INTRAMUSCULAR

## 2018-12-05 MED ORDER — KETOROLAC TROMETHAMINE 30 MG/ML IJ SOLN: 30.0000 mg | Freq: Once | INTRAMUSCULAR | Status: DC

## 2018-12-05 MED ORDER — NAPROXEN 500 MG PO TABS: ORAL_TABLET | ORAL | 0 refills | Status: AC

## 2018-12-05 NOTE — ED Notes (Signed)
Pt given d/c instructions regarding home isolation. Verbalizes understanding to return to ED for SOB and/or difficulty breathing. Rx x 1 given for naproxen. Advised pt to f/u with PCP on Monday by phone for further instructions

## 2019-09-09 ENCOUNTER — Emergency Department (HOSPITAL_BASED_OUTPATIENT_CLINIC_OR_DEPARTMENT_OTHER)
Admission: EM | Admit: 2019-09-09 | Discharge: 2019-09-09 | Disposition: A | Discharge status: Home / Self Care | Payer: Self-pay | Attending: Emergency Medicine | Admitting: Emergency Medicine

## 2019-09-09 ENCOUNTER — Encounter (HOSPITAL_BASED_OUTPATIENT_CLINIC_OR_DEPARTMENT_OTHER): Payer: Self-pay

## 2019-09-09 ENCOUNTER — Other Ambulatory Visit: Payer: Self-pay

## 2019-09-09 DIAGNOSIS — R42 Dizziness and giddiness: Secondary | ICD-10-CM | POA: Insufficient documentation

## 2019-09-09 LAB — BASIC METABOLIC PANEL
Anion gap: 9 (ref 5–15)
BUN: 15 mg/dL (ref 6–20)
CO2: 26 mmol/L (ref 22–32)
Calcium: 8.9 mg/dL (ref 8.9–10.3)
Chloride: 105 mmol/L (ref 98–111)
Creatinine, Ser: 0.94 mg/dL (ref 0.61–1.24)
GFR calc Af Amer: >60 mL/min (ref >60)
GFR calc non Af Amer: >60 mL/min (ref >60)
Glucose, Bld: 118 mg/dL — ABNORMAL HIGH (ref 70–99)
Potassium: 4 mmol/L (ref 3.5–5.1)
Sodium: 140 mmol/L (ref 135–145)

## 2019-09-09 LAB — CBC
HCT: 47.3 % (ref 39.0–52.0)
Hemoglobin: 15.7 g/dL (ref 13.0–17.0)
MCH: 29.8 pg (ref 26.0–34.0)
MCHC: 33.2 g/dL (ref 30.0–36.0)
MCV: 89.9 fL (ref 80.0–100.0)
Platelets: 232 10*3/uL (ref 150–400)
RBC: 5.26 MIL/uL (ref 4.22–5.81)
RDW: 14.2 % (ref 11.5–15.5)
WBC: 4 10*3/uL (ref 4.0–10.5)
nRBC: 0 % (ref 0.0–0.2)

## 2019-09-09 LAB — CBG MONITORING, ED: Glucose-Capillary: 101 mg/dL — ABNORMAL HIGH (ref 70–99)

## 2019-09-09 MED ORDER — SODIUM CHLORIDE 0.9% FLUSH
3.0000 mL | Freq: Once | INTRAVENOUS | Status: DC
  Filled 2019-09-09: qty 3

## 2019-09-09 NOTE — ED Notes (Signed)
Pt did not get dizzy upon sitting or standing

## 2019-09-09 NOTE — ED Triage Notes (Signed)
Pt c/o feeling lightheaded, weak x 2 weeks-denies fever/flu sx-NAD-steady gait

## 2019-09-09 NOTE — ED Provider Notes (Signed)
[facility] EMERGENCY DEPARTMENT Provider Note   CSN: [id number] Arrival date & time: 09/09/19  1538     History Chief Complaint  Patient presents with  . Dizziness    Thomas Roy is a 19 y.o. male with no significant past medical history who presents today for evaluation of feeling lightheaded. He reports that about a month ago he started a job at [employer] loading [employer].  2 weeks ago he had his hours increased significantly from only 3 or 4 hours up to 7 or 8 hours a day.  He states that at that time he would notice after about 4 to 5 hours that he would start feeling lightheaded like he was going to pass out.  He reports during this his vision would get blurry and he would get sweaty, however he denies any nausea vomiting or chest pain.  He states that he is consistently on his feet for the entire time during this.  He denies any changes in medications or diet.  No other instigating events. He reports that he takes a few sips of water every 15 minutes.  He denies any soda, caffeine, or significant dietary changes during this time.  He denies any fevers or recent traumas.  He states that when he sits down or lays down after his symptoms resolve.  He denies any myalgias or arthralgias.HPI He states that he has not previously had a job or tasks that required him to be on his feet for 4 to 5 hours consistently.  He denies any similar symptoms with exercise.  He has never had a syncopal event while exercising, no known family history of HOCM    History reviewed. No pertinent past medical history.  Patient Active Problem List   Diagnosis Date Noted  . Fungal toenail infection 08/03/2018  . Paronychia of toenail, left 08/03/2018    Past Surgical History:  Procedure Laterality Date  . ABDOMINAL SURGERY         Family History  Problem Relation Age of Onset  . Other Mother        Healthy    Social History   Tobacco Use  . Smoking status: Never Smoker  . Smokeless  tobacco: Never Used  Substance Use Topics  . Alcohol use: No  . Drug use: No    Home Medications Prior to Admission medications   Medication Sig Start Date End Date Taking? Authorizing Provider  naproxen (NAPROSYN) 500 MG tablet Take 1 tablet twice daily as needed for fever, body aches or headache. 12/05/18   Molpus, John, MD    Allergies    Coconut oil  Review of Systems   Review of Systems  Constitutional: Negative for chills, fatigue and fever.  Respiratory: Negative for choking, chest tightness and shortness of breath.   Cardiovascular: Negative for chest pain.  Musculoskeletal: Negative for back pain and neck pain.  Neurological: Positive for light-headedness. Negative for dizziness, speech difficulty and weakness.  Psychiatric/Behavioral: Negative for confusion.  All other systems reviewed and are negative.   Physical Exam Updated Vital Signs BP 104/65 (BP Location: Left Arm)   Pulse (!) 57   Temp 98.8 F (37.1 C) (Oral)   Resp 14   Ht 6\' 4"  (1.93 m)   Wt 107 kg   SpO2 100%   BMI 28.73 kg/m   Physical Exam Vitals and nursing note reviewed.  Constitutional:      General: He is not in acute distress.    Appearance: He is well-developed. He  is not diaphoretic.  HENT:     Head: Normocephalic and atraumatic.     Mouth/Throat:     Mouth: Mucous membranes are moist.  Eyes:     General: No scleral icterus.       Right eye: No discharge.        Left eye: No discharge.     Conjunctiva/sclera: Conjunctivae normal.  Cardiovascular:     Rate and Rhythm: Normal rate and regular rhythm.     Pulses: Normal pulses.     Heart sounds: Normal heart sounds. No murmur.  Pulmonary:     Effort: Pulmonary effort is normal. No respiratory distress.     Breath sounds: No stridor.  Abdominal:     General: Abdomen is flat. There is no distension.     Tenderness: There is no abdominal tenderness.  Musculoskeletal:        General: No deformity or signs of injury.     Cervical  back: Normal range of motion and neck supple.     Right lower leg: No edema.     Left lower leg: No edema.  Skin:    General: Skin is warm and dry.  Neurological:     General: No focal deficit present.     Mental Status: He is alert.     Cranial Nerves: No cranial nerve deficit.     Sensory: No sensory deficit.     Motor: No abnormal muscle tone.  Psychiatric:        Mood and Affect: Mood normal.        Behavior: Behavior normal.     ED Results / Procedures / Treatments   Labs (all labs ordered are listed, but only abnormal results are displayed) Labs Reviewed  BASIC METABOLIC PANEL - Abnormal; Notable for the following components:      Result Value   Glucose, Bld 118 (*)    All other components within normal limits  CBG MONITORING, ED - Abnormal; Notable for the following components:   Glucose-Capillary 101 (*)    All other components within normal limits  CBC    EKG EKG Interpretation  Date/Time:  Friday September 09 2019 15:47:55 EDT Ventricular Rate:  58 PR Interval:  130 QRS Duration: 84 QT Interval:  380 QTC Calculation: 373 R Axis:   65 Text Interpretation: Sinus bradycardia Otherwise normal ECG no previous Confirmed by Vanetta Mulders [phone]) on 09/09/2019 4:11:29 PM   Radiology No results found.  Procedures Procedures (including critical care time)  Medications Ordered in ED Medications  sodium chloride flush (NS) 0.9 % injection 3 mL (3 mLs Intravenous Not Given 09/09/19 1748)    ED Course  I have reviewed the triage vital signs and the nursing notes.  Pertinent labs & imaging results that were available during my care of the patient were reviewed by me and considered in my medical decision making (see chart for details). Orthostatic VS for the past 24 hrs:  BP- Lying Pulse- Lying BP- Sitting Pulse- Sitting BP- Standing at 0 minutes Pulse- Standing at 0 minutes  09/09/19 1756 100/54 53 107/66 53 112/70 59       MDM Rules/Calculators/A&P                      Patient is a healthy 19 year old man who presents today for evaluation of lightheadedness.  His lightheadedness started after he started working longer shifts at [employer] and noted that after multiple hours on his feet he would start to  get lightheaded.  Basic labs were obtained without significant hematologic or electrolyte derangements.  He is not orthostatic here.  He does not get similar symptoms when working out, rather it is only upon standing for multiple hours that he starts feeling this way. I suspect that this is orthostatic related hypotension from prolonged standing. Recommended compression socks, increasing fluid intake and salt intake.  We also discussed that if his symptoms worsen, he develops chest pain at any point or notices that he gets similar symptoms when working out or performing strenuous activity that he needs to stop all of these until cleared to resume by a physician.  Return precautions were discussed with patient who states their undestanding.  At the time of discharge patient denied any unaddressed complaints or concerns.  Patient is agreeable for discharge home.  Note: Portions of this report may have been transcribed using voice recognition software. Every effort was made to ensure accuracy; however, inadvertent computerized transcription errors may be present  Final Clinical Impression(s) / ED Diagnoses Final diagnoses:  Lightheadedness    Rx / DC Orders ED Discharge Orders    None       Norman Clay 09/09/19 2235    Vanetta Mulders, MD 09/21/19 [phone]

## 2019-09-09 NOTE — Discharge Instructions (Addendum)
Please make sure you are drinking adequate water.  The best way to tell this is that you should be able to urinate every 2-3 hours while exercising/at work and your urine should be light yellow to clear.  I also recommend that you try to drink Gatorade, half Gatorade half water is a good mix, in addition to eating a salty snack such as pretzels, baked chips every 2-3 hours while you are at work to help keep your blood pressure elevated.  If you find that you develop these symptoms within the first hour of any strenuous exercise, they are not relieved by increasing water, and salt intake, you fully pass out, or have other concerns please stop all strenuous activity and do not exercise until cleared to do so by a physician.

## 2020-03-21 ENCOUNTER — Other Ambulatory Visit: Payer: Self-pay

## 2020-03-21 ENCOUNTER — Emergency Department (HOSPITAL_BASED_OUTPATIENT_CLINIC_OR_DEPARTMENT_OTHER): Payer: Self-pay

## 2020-03-21 ENCOUNTER — Encounter (HOSPITAL_BASED_OUTPATIENT_CLINIC_OR_DEPARTMENT_OTHER): Payer: Self-pay | Admitting: *Deleted

## 2020-03-21 DIAGNOSIS — J069 Acute upper respiratory infection, unspecified: Secondary | ICD-10-CM | POA: Insufficient documentation

## 2020-03-21 DIAGNOSIS — Z20822 Contact with and (suspected) exposure to covid-19: Secondary | ICD-10-CM | POA: Insufficient documentation

## 2020-03-21 LAB — RESPIRATORY PANEL BY RT PCR (FLU A&B, COVID)
Influenza A by PCR: NEGATIVE
Influenza B by PCR: NEGATIVE
SARS Coronavirus 2 by RT PCR: NEGATIVE

## 2020-03-21 NOTE — ED Triage Notes (Signed)
Pt states he has been coughing since Sunday night. Today, he coughed up dark red blood (about the amount to fill the palm of his hand). No fevers, no chest pain, no shortness, or night sweats.

## 2020-03-22 ENCOUNTER — Emergency Department (HOSPITAL_BASED_OUTPATIENT_CLINIC_OR_DEPARTMENT_OTHER)
Admission: EM | Admit: 2020-03-22 | Discharge: 2020-03-22 | Disposition: A | Discharge status: Home / Self Care | Payer: Self-pay | Attending: Emergency Medicine | Admitting: Emergency Medicine

## 2020-03-22 DIAGNOSIS — J069 Acute upper respiratory infection, unspecified: Secondary | ICD-10-CM

## 2020-03-22 LAB — CBC
HCT: 49 % (ref 39.0–52.0)
Hemoglobin: 16.1 g/dL (ref 13.0–17.0)
MCH: 29.4 pg (ref 26.0–34.0)
MCHC: 32.9 g/dL (ref 30.0–36.0)
MCV: 89.6 fL (ref 80.0–100.0)
Platelets: 205 10*3/uL (ref 150–400)
RBC: 5.47 MIL/uL (ref 4.22–5.81)
RDW: 13.5 % (ref 11.5–15.5)
WBC: 3.7 10*3/uL — ABNORMAL LOW (ref 4.0–10.5)
nRBC: 0 % (ref 0.0–0.2)

## 2020-03-22 LAB — BASIC METABOLIC PANEL
Anion gap: 9 (ref 5–15)
BUN: 15 mg/dL (ref 6–20)
CO2: 27 mmol/L (ref 22–32)
Calcium: 8.9 mg/dL (ref 8.9–10.3)
Chloride: 103 mmol/L (ref 98–111)
Creatinine, Ser: 1 mg/dL (ref 0.61–1.24)
GFR calc non Af Amer: >60 mL/min (ref >60)
Glucose, Bld: 79 mg/dL (ref 70–99)
Potassium: 3.8 mmol/L (ref 3.5–5.1)
Sodium: 139 mmol/L (ref 135–145)

## 2020-03-22 LAB — D-DIMER, QUANTITATIVE: D-Dimer, Quant: <0.27 ug/mL-FEU (ref 0.00–0.50)

## 2020-03-22 MED ORDER — BENZONATATE 200 MG PO CAPS: 200.0000 mg | ORAL_CAPSULE | Freq: Three times a day (TID) | ORAL | 0 refills | Status: AC | PRN

## 2020-03-22 MED ORDER — PREDNISONE 20 MG PO TABS: 40.0000 mg | ORAL_TABLET | Freq: Every day | ORAL | 0 refills | Status: AC

## 2020-03-22 NOTE — ED Provider Notes (Signed)
[facility] EMERGENCY DEPARTMENT Provider Note   CSN: [id number] Arrival date & time: 03/21/20  2117     History Chief Complaint  Patient presents with  . Hemoptysis    Thomas Roy is a 19 y.o. male.  Patient presents to the emergency department for evaluation of cough, chest congestion.  Patient reports that symptoms have been present for 3 days.  Up until today the cough has been nonproductive.  Tonight he coughed and brought up blood tinged mucus.  Patient denies any chest pain.  He has not had shortness of breath.  No chronic lung disease.  Patient denies any recent surgery, long distance travel.  No unilateral leg pain, calf swelling.        History reviewed. No pertinent past medical history.  Patient Active Problem List   Diagnosis Date Noted  . Fungal toenail infection 08/03/2018  . Paronychia of toenail, left 08/03/2018    Past Surgical History:  Procedure Laterality Date  . ABDOMINAL SURGERY         Family History  Problem Relation Age of Onset  . Other Mother        Healthy    Social History   Tobacco Use  . Smoking status: Never Smoker  . Smokeless tobacco: Never Used  Vaping Use  . Vaping Use: Never used  Substance Use Topics  . Alcohol use: No  . Drug use: No    Home Medications Prior to Admission medications   Medication Sig Start Date End Date Taking? Authorizing Provider  benzonatate (TESSALON) 200 MG capsule Take 1 capsule (200 mg total) by mouth 3 (three) times daily as needed for cough. 03/22/20   Gilda Crease, MD  naproxen (NAPROSYN) 500 MG tablet Take 1 tablet twice daily as needed for fever, body aches or headache. 12/05/18   Molpus, Jonny Ruiz, MD  predniSONE (DELTASONE) 20 MG tablet Take 2 tablets (40 mg total) by mouth daily with breakfast. 03/22/20   Tifanny Dollens, Canary Brim, MD    Allergies    Coconut oil  Review of Systems   Review of Systems  Respiratory: Positive for cough.   All other systems reviewed  and are negative.   Physical Exam Updated Vital Signs BP 103/72 (BP Location: Left Arm)   Pulse 66   Temp 98.1 F (36.7 C) (Oral)   Resp 18   SpO2 100%   Physical Exam Vitals and nursing note reviewed.  Constitutional:      General: He is not in acute distress.    Appearance: Normal appearance. He is well-developed.  HENT:     Head: Normocephalic and atraumatic.     Right Ear: Hearing normal.     Left Ear: Hearing normal.     Nose: Nose normal.  Eyes:     Conjunctiva/sclera: Conjunctivae normal.     Pupils: Pupils are equal, round, and reactive to light.  Cardiovascular:     Rate and Rhythm: Regular rhythm.     Heart sounds: S1 normal and S2 normal. No murmur heard.  No friction rub. No gallop.   Pulmonary:     Effort: Pulmonary effort is normal. No respiratory distress.     Breath sounds: Normal breath sounds.  Chest:     Chest wall: No tenderness.  Abdominal:     General: Bowel sounds are normal.     Palpations: Abdomen is soft.     Tenderness: There is no abdominal tenderness. There is no guarding or rebound. Negative signs include Murphy's sign and  McBurney's sign.     Hernia: No hernia is present.  Musculoskeletal:        General: Normal range of motion.     Cervical back: Normal range of motion and neck supple.  Skin:    General: Skin is warm and dry.     Findings: No rash.  Neurological:     Mental Status: He is alert and oriented to person, place, and time.     GCS: GCS eye subscore is 4. GCS verbal subscore is 5. GCS motor subscore is 6.     Cranial Nerves: No cranial nerve deficit.     Sensory: No sensory deficit.     Coordination: Coordination normal.  Psychiatric:        Speech: Speech normal.        Behavior: Behavior normal.        Thought Content: Thought content normal.     ED Results / Procedures / Treatments   Labs (all labs ordered are listed, but only abnormal results are displayed) Labs Reviewed  CBC - Abnormal; Notable for the  following components:      Result Value   WBC 3.7 (*)    All other components within normal limits  RESPIRATORY PANEL BY RT PCR (FLU A&B, COVID)  BASIC METABOLIC PANEL  D-DIMER, QUANTITATIVE (NOT AT [facility])    EKG None  Radiology DG Chest 2 View  Result Date: 03/21/2020 CLINICAL DATA:  Cough EXAM: CHEST - 2 VIEW COMPARISON:  None. FINDINGS: The heart size and mediastinal contours are within normal limits. Both lungs are clear. The visualized skeletal structures are unremarkable. IMPRESSION: No active cardiopulmonary disease. Electronically Signed   By: Jasmine Pang M.D.   On: 03/21/2020 22:01    Procedures Procedures (including critical care time)  Medications Ordered in ED Medications - No data to display  ED Course  I have reviewed the triage vital signs and the nursing notes.  Pertinent labs & imaging results that were available during my care of the patient were reviewed by me and considered in my medical decision making (see chart for details).    MDM Rules/Calculators/A&P                          Patient presents to the emergency department for evaluation of cough that has been ongoing for several days.  He reports that he had blood in his sputum tonight.  Patient is in no distress.  He is PERC and Wells negative.  Additionally D-dimer is normal.  No concern for PE.  Oxygenation is normal.  Chest x-ray shows no pneumonia or other pathology.  Covid is negative.  Patient reassured, symptomatic treatment for URI.  Final Clinical Impression(s) / ED Diagnoses Final diagnoses:  Upper respiratory tract infection, unspecified type    Rx / DC Orders ED Discharge Orders         Ordered    benzonatate (TESSALON) 200 MG capsule  3 times daily PRN        03/22/20 0239    predniSONE (DELTASONE) 20 MG tablet  Daily with breakfast        03/22/20 0239           Gilda Crease, MD 03/22/20 [phone]

## 2020-04-02 ENCOUNTER — Ambulatory Visit: Payer: Self-pay | Admitting: Family Medicine

## 2021-01-10 ENCOUNTER — Emergency Department (HOSPITAL_COMMUNITY)
Admission: EM | Admit: 2021-01-10 | Discharge: 2021-01-10 | Disposition: A | Discharge status: Home / Self Care | Payer: Self-pay | Attending: Emergency Medicine | Admitting: Emergency Medicine

## 2021-01-10 DIAGNOSIS — Z202 Contact with and (suspected) exposure to infections with a predominantly sexual mode of transmission: Secondary | ICD-10-CM | POA: Insufficient documentation

## 2021-01-10 DIAGNOSIS — R300 Dysuria: Secondary | ICD-10-CM | POA: Insufficient documentation

## 2021-01-10 DIAGNOSIS — Z711 Person with feared health complaint in whom no diagnosis is made: Secondary | ICD-10-CM

## 2021-01-10 LAB — URINALYSIS, ROUTINE W REFLEX MICROSCOPIC
Bilirubin Urine: NEGATIVE
Glucose, UA: NEGATIVE mg/dL
Ketones, ur: NEGATIVE mg/dL
Nitrite: NEGATIVE
Protein, ur: 30 mg/dL — AB
Specific Gravity, Urine: 1.028 (ref 1.005–1.030)
WBC, UA: >50 WBC/hpf — ABNORMAL HIGH (ref 0–5)
pH: 5 (ref 5.0–8.0)

## 2021-01-10 LAB — RPR: RPR Ser Ql: NONREACTIVE

## 2021-01-10 LAB — HIV ANTIBODY (ROUTINE TESTING W REFLEX): HIV Screen 4th Generation wRfx: NONREACTIVE

## 2021-01-10 LAB — CBG MONITORING, ED: Glucose-Capillary: 97 mg/dL (ref 70–99)

## 2021-01-10 MED ORDER — AZITHROMYCIN 250 MG PO TABS
1000.0000 mg | ORAL_TABLET | Freq: Every day | ORAL | Status: DC
  Administered 2021-01-10: 1000 mg via ORAL
  Filled 2021-01-10: qty 4

## 2021-01-10 MED ORDER — CEFTRIAXONE SODIUM 1 G IJ SOLR
1.0000 g | Freq: Once | INTRAMUSCULAR | Status: AC
  Administered 2021-01-10: 1 g via INTRAMUSCULAR
  Filled 2021-01-10: qty 10

## 2021-01-10 MED ORDER — STERILE WATER FOR INJECTION IJ SOLN
INTRAMUSCULAR | Status: AC
  Administered 2021-01-10: 2.1 mL
  Filled 2021-01-10: qty 10

## 2021-01-10 NOTE — Discharge Instructions (Addendum)
Your STD screening will take approximately 2 days.  You are given antibiotics here in the emergency department that would cover for gonorrhea chlamydia.  If your HIV or syphilis testing is positive follow-up with your primary care provider  Return for new or worsening symptoms.

## 2021-01-10 NOTE — ED Provider Notes (Signed)
[facility] EMERGENCY DEPARTMENT Provider Note   CSN: [id number] Arrival date & time: 01/10/21  1062     History Concern for STD   Thomas Roy is a 20 y.o. male with past medical history who presents for evaluation of concern for STD.  Over the last 24 hours has had some burning with urination.  Sexually active, intermittently uses protection.  He is unsure if he has any STDs previously.  He denies any rashes, lesions.  Denies any penile discharge, pain, swelling to scrotum.  Pain with bowel movements.  Denies additional aggravating or alleviating factors  History obtained from patient and past medical records.  No interpreter used.  HPI     No past medical history on file.  Patient Active Problem List   Diagnosis Date Noted   Fungal toenail infection 08/03/2018   Paronychia of toenail, left 08/03/2018    Past Surgical History:  Procedure Laterality Date   ABDOMINAL SURGERY         Family History  Problem Relation Age of Onset   Other Mother        Healthy    Social History   Tobacco Use   Smoking status: Never   Smokeless tobacco: Never  Vaping Use   Vaping Use: Never used  Substance Use Topics   Alcohol use: No   Drug use: No    Home Medications Prior to Admission medications   Medication Sig Start Date End Date Taking? Authorizing Provider  benzonatate (TESSALON) 200 MG capsule Take 1 capsule (200 mg total) by mouth 3 (three) times daily as needed for cough. 03/22/20   Gilda Crease, MD  naproxen (NAPROSYN) 500 MG tablet Take 1 tablet twice daily as needed for fever, body aches or headache. 12/05/18   Molpus, Jonny Ruiz, MD  predniSONE (DELTASONE) 20 MG tablet Take 2 tablets (40 mg total) by mouth daily with breakfast. 03/22/20   Pollina, Canary Brim, MD    Allergies    Coconut oil  Review of Systems   Review of Systems  Constitutional: Negative.   HENT: Negative.    Respiratory: Negative.    Cardiovascular: Negative.    Gastrointestinal: Negative.   Genitourinary:  Positive for dysuria. Negative for decreased urine volume, difficulty urinating, flank pain, frequency, genital sores, hematuria, penile discharge, penile pain, penile swelling, scrotal swelling, testicular pain and urgency.  Musculoskeletal: Negative.   Neurological: Negative.   All other systems reviewed and are negative.  Physical Exam Updated Vital Signs BP 112/76 (BP Location: Left Arm)   Pulse 77   Temp 98.8 F (37.1 C) (Oral)   Resp 18   SpO2 98%   Physical Exam Vitals and nursing note reviewed.  Constitutional:      General: He is not in acute distress.    Appearance: He is well-developed. He is not ill-appearing, toxic-appearing or diaphoretic.  HENT:     Head: Normocephalic and atraumatic.     Mouth/Throat:     Mouth: Mucous membranes are moist.  Eyes:     Pupils: Pupils are equal, round, and reactive to light.  Cardiovascular:     Rate and Rhythm: Normal rate and regular rhythm.     Pulses: Normal pulses.     Heart sounds: Normal heart sounds.  Pulmonary:     Effort: Pulmonary effort is normal. No respiratory distress.  Abdominal:     General: Bowel sounds are normal. There is no distension.     Palpations: Abdomen is soft.  Genitourinary:  Comments: Declines GU exam Musculoskeletal:        General: Normal range of motion.     Cervical back: Normal range of motion and neck supple.  Skin:    General: Skin is warm and dry.     Capillary Refill: Capillary refill takes less than 2 seconds.  Neurological:     General: No focal deficit present.     Mental Status: He is alert and oriented to person, place, and time.    ED Results / Procedures / Treatments   Labs (all labs ordered are listed, but only abnormal results are displayed) Labs Reviewed  URINALYSIS, ROUTINE W REFLEX MICROSCOPIC  RPR  HIV ANTIBODY (ROUTINE TESTING W REFLEX)  CBG MONITORING, ED  GC/CHLAMYDIA PROBE AMP ([place]) NOT AT [facility]     EKG None  Radiology No results found.  Procedures Procedures   Medications Ordered in ED Medications  cefTRIAXone (ROCEPHIN) injection 1 g (has no administration in time range)  azithromycin (ZITHROMAX) tablet 1,000 mg (has no administration in time range)    ED Course  I have reviewed the triage vital signs and the nursing notes.  Pertinent labs & imaging results that were available during my care of the patient were reviewed by me and considered in my medical decision making (see chart for details).  Here for evaluation of concern for STDs.  Had some intermittent burning with urination the last 24 hours. CBG WNL  Patient is afebrile without abdominal tenderness, abdominal pain or painful bowel movements to indicate prostatitis.  Declines Gi exam however denies pain to testes or epididymis to suggest orchitis or epididymitis.  STD cultures obtained including HIV, syphilis, gonorrhea and chlamydia. Patient to be discharged with instructions to follow up with PCP. Discussed importance of using protection when sexually active. Pt understands that they have GC/Chlamydia cultures pending and that they will need to inform all sexual partners if results return positive. Patient has been treated prophylactically with azithromycin and Rocephin.       MDM Rules/Calculators/A&P                            Final Clinical Impression(s) / ED Diagnoses Final diagnoses:  Concern about STD in male without diagnosis    Rx / DC Orders ED Discharge Orders     None        Zaryan Yakubov A, PA-C 01/10/21 0945    Alvira Monday, MD 01/10/21 1058

## 2021-01-11 LAB — GC/CHLAMYDIA PROBE AMP (~~LOC~~) NOT AT ARMC
Chlamydia: NEGATIVE
Comment: NEGATIVE
Comment: NORMAL
Neisseria Gonorrhea: POSITIVE — AB

## 2021-01-31 ENCOUNTER — Emergency Department (HOSPITAL_BASED_OUTPATIENT_CLINIC_OR_DEPARTMENT_OTHER)
Admission: EM | Admit: 2021-01-31 | Discharge: 2021-01-31 | Disposition: A | Discharge status: Home / Self Care | Payer: Self-pay | Attending: Emergency Medicine | Admitting: Emergency Medicine

## 2021-01-31 ENCOUNTER — Encounter (HOSPITAL_BASED_OUTPATIENT_CLINIC_OR_DEPARTMENT_OTHER): Payer: Self-pay | Admitting: *Deleted

## 2021-01-31 ENCOUNTER — Emergency Department (HOSPITAL_BASED_OUTPATIENT_CLINIC_OR_DEPARTMENT_OTHER): Payer: Self-pay

## 2021-01-31 ENCOUNTER — Other Ambulatory Visit: Payer: Self-pay

## 2021-01-31 DIAGNOSIS — R1013 Epigastric pain: Secondary | ICD-10-CM

## 2021-01-31 DIAGNOSIS — R1011 Right upper quadrant pain: Secondary | ICD-10-CM | POA: Insufficient documentation

## 2021-01-31 DIAGNOSIS — R112 Nausea with vomiting, unspecified: Secondary | ICD-10-CM | POA: Insufficient documentation

## 2021-01-31 DIAGNOSIS — R52 Pain, unspecified: Secondary | ICD-10-CM

## 2021-01-31 LAB — CBC
HCT: 46.2 % (ref 39.0–52.0)
Hemoglobin: 15.8 g/dL (ref 13.0–17.0)
MCH: 30.1 pg (ref 26.0–34.0)
MCHC: 34.2 g/dL (ref 30.0–36.0)
MCV: 88 fL (ref 80.0–100.0)
Platelets: 218 10*3/uL (ref 150–400)
RBC: 5.25 MIL/uL (ref 4.22–5.81)
RDW: 13.6 % (ref 11.5–15.5)
WBC: 4.4 10*3/uL (ref 4.0–10.5)
nRBC: 0 % (ref 0.0–0.2)

## 2021-01-31 LAB — COMPREHENSIVE METABOLIC PANEL
ALT: 17 U/L (ref 0–44)
AST: 20 U/L (ref 15–41)
Albumin: 4.3 g/dL (ref 3.5–5.0)
Alkaline Phosphatase: 58 U/L (ref 38–126)
Anion gap: 7 (ref 5–15)
BUN: 14 mg/dL (ref 6–20)
CO2: 26 mmol/L (ref 22–32)
Calcium: 8.8 mg/dL — ABNORMAL LOW (ref 8.9–10.3)
Chloride: 106 mmol/L (ref 98–111)
Creatinine, Ser: 0.9 mg/dL (ref 0.61–1.24)
GFR, Estimated: >60 mL/min (ref >60)
Glucose, Bld: 107 mg/dL — ABNORMAL HIGH (ref 70–99)
Potassium: 3.8 mmol/L (ref 3.5–5.1)
Sodium: 139 mmol/L (ref 135–145)
Total Bilirubin: 0.5 mg/dL (ref 0.3–1.2)
Total Protein: 7.2 g/dL (ref 6.5–8.1)

## 2021-01-31 LAB — LIPASE, BLOOD: Lipase: 35 U/L (ref 11–51)

## 2021-01-31 LAB — URINALYSIS, ROUTINE W REFLEX MICROSCOPIC
Bilirubin Urine: NEGATIVE
Glucose, UA: NEGATIVE mg/dL
Hgb urine dipstick: NEGATIVE
Ketones, ur: NEGATIVE mg/dL
Leukocytes,Ua: NEGATIVE
Nitrite: NEGATIVE
Protein, ur: NEGATIVE mg/dL
Specific Gravity, Urine: 1.02 (ref 1.005–1.030)
pH: 7.5 (ref 5.0–8.0)

## 2021-01-31 MED ORDER — PANTOPRAZOLE SODIUM 20 MG PO TBEC: 20.0000 mg | DELAYED_RELEASE_TABLET | Freq: Every day | ORAL | 0 refills | Status: AC

## 2021-01-31 MED ORDER — ONDANSETRON HCL 4 MG/2ML IJ SOLN
4.0000 mg | Freq: Once | INTRAMUSCULAR | Status: AC
  Administered 2021-01-31: 4 mg via INTRAVENOUS
  Filled 2021-01-31: qty 2

## 2021-01-31 MED ORDER — SODIUM CHLORIDE 0.9 % IV BOLUS
1000.0000 mL | Freq: Once | INTRAVENOUS | Status: AC
  Administered 2021-01-31: 1000 mL via INTRAVENOUS

## 2021-01-31 NOTE — ED Triage Notes (Signed)
Abdominal pain on and off x 2 weeks. Pain is in his RUQ.

## 2021-01-31 NOTE — ED Provider Notes (Signed)
[facility] EMERGENCY DEPARTMENT Provider Note   CSN: [id number] Arrival date & time: 01/31/21  1903     History Chief Complaint  Patient presents with   Abdominal Pain    Thomas Roy is a 20 y.o. male.  The history is provided by the patient.  Abdominal Pain Pain location:  RUQ and epigastric Pain quality: aching   Pain radiates to:  Does not radiate Pain severity:  Mild Onset quality:  Gradual Duration:  2 weeks Timing:  Intermittent Progression:  Waxing and waning Chronicity:  New Context: eating   Relieved by:  Nothing Worsened by:  Nothing Associated symptoms: nausea and vomiting   Associated symptoms: no chest pain, no chills, no cough, no dysuria, no fever, no hematuria, no shortness of breath and no sore throat       History reviewed. No pertinent past medical history.  Patient Active Problem List   Diagnosis Date Noted   Fungal toenail infection 08/03/2018   Paronychia of toenail, left 08/03/2018    Past Surgical History:  Procedure Laterality Date   ABDOMINAL SURGERY         Family History  Problem Relation Age of Onset   Other Mother        Healthy    Social History   Tobacco Use   Smoking status: Never   Smokeless tobacco: Never  Vaping Use   Vaping Use: Some days  Substance Use Topics   Alcohol use: No   Drug use: No    Home Medications Prior to Admission medications   Medication Sig Start Date End Date Taking? Authorizing Provider  pantoprazole (PROTONIX) 20 MG tablet Take 1 tablet (20 mg total) by mouth daily for 14 days. 01/31/21 02/14/21 Yes Kentrell Hallahan, DO  benzonatate (TESSALON) 200 MG capsule Take 1 capsule (200 mg total) by mouth 3 (three) times daily as needed for cough. 03/22/20   Gilda Crease, MD  naproxen (NAPROSYN) 500 MG tablet Take 1 tablet twice daily as needed for fever, body aches or headache. 12/05/18   Molpus, Jonny Ruiz, MD  predniSONE (DELTASONE) 20 MG tablet Take 2 tablets (40 mg total) by  mouth daily with breakfast. 03/22/20   Pollina, Canary Brim, MD    Allergies    Coconut oil  Review of Systems   Review of Systems  Constitutional:  Negative for chills and fever.  HENT:  Negative for ear pain and sore throat.   Eyes:  Negative for pain and visual disturbance.  Respiratory:  Negative for cough and shortness of breath.   Cardiovascular:  Negative for chest pain and palpitations.  Gastrointestinal:  Positive for abdominal pain, nausea and vomiting.  Genitourinary:  Negative for dysuria and hematuria.  Musculoskeletal:  Negative for arthralgias and back pain.  Skin:  Negative for color change and rash.  Neurological:  Negative for seizures and syncope.  All other systems reviewed and are negative.  Physical Exam Updated Vital Signs BP 117/65 (BP Location: Left Arm)   Pulse (!) 51   Temp 98.7 F (37.1 C) (Oral)   Resp 18   Ht 6\' 4"  (1.93 m)   Wt 99.8 kg   SpO2 100%   BMI 26.78 kg/m   Physical Exam Vitals and nursing note reviewed.  Constitutional:      General: He is not in acute distress.    Appearance: He is well-developed. He is not ill-appearing.  HENT:     Head: Normocephalic and atraumatic.  Eyes:     Extraocular Movements:  Extraocular movements intact.     Conjunctiva/sclera: Conjunctivae normal.     Pupils: Pupils are equal, round, and reactive to light.  Cardiovascular:     Rate and Rhythm: Normal rate and regular rhythm.     Heart sounds: Normal heart sounds. No murmur heard. Pulmonary:     Effort: Pulmonary effort is normal. No respiratory distress.     Breath sounds: Normal breath sounds.  Abdominal:     General: There is no distension.     Palpations: Abdomen is soft.     Tenderness: There is abdominal tenderness in the right upper quadrant and epigastric area. There is no guarding or rebound.     Hernia: No hernia is present.  Musculoskeletal:     Cervical back: Neck supple.  Skin:    General: Skin is warm and dry.     Capillary  Refill: Capillary refill takes less than 2 seconds.  Neurological:     General: No focal deficit present.     Mental Status: He is alert.    ED Results / Procedures / Treatments   Labs (all labs ordered are listed, but only abnormal results are displayed) Labs Reviewed  COMPREHENSIVE METABOLIC PANEL - Abnormal; Notable for the following components:      Result Value   Glucose, Bld 107 (*)    Calcium 8.8 (*)    All other components within normal limits  LIPASE, BLOOD  CBC  URINALYSIS, ROUTINE W REFLEX MICROSCOPIC    EKG None  Radiology US Abdomen Limited RUQ (LIVER/GB)  Result Date: 01/31/2021 CLINICAL DATA:  Right upper quadrant pain EXAM: ULTRASOUND ABDOMEN LIMITED RIGHT UPPER QUADRANT COMPARISON:  None. FINDINGS: Gallbladder: Gallbladder is contracted consistent with postprandial state. Common bile duct: Diameter: 3.6 mm. Liver: No focal lesion identified. Within normal limits in parenchymal echogenicity. Portal vein is patent on color Doppler imaging with normal direction of blood flow towards the liver. Other: None. IMPRESSION: Unremarkable right upper quadrant ultrasound. Contracted gallbladder is noted consistent with a postprandial state. Electronically Signed   By: Alcide Clever M.D.   On: 01/31/2021 20:08    Procedures Procedures   Medications Ordered in ED Medications  sodium chloride 0.9 % bolus 1,000 mL (1,000 mLs Intravenous New Bag/Given 01/31/21 1928)  ondansetron (ZOFRAN) injection 4 mg (4 mg Intravenous Given 01/31/21 1929)    ED Course  I have reviewed the triage vital signs and the nursing notes.  Pertinent labs & imaging results that were available during my care of the patient were reviewed by me and considered in my medical decision making (see chart for details).    MDM Rules/Calculators/A&P                           Thomas Roy is here with upper abdominal pain on and off for the last 2 weeks.  Gallbladder ultrasound showed no gallstones or  cholecystitis.  Lipase normal doubt pancreatitis.  No significant anemia, electrolyte ab Mody, kidney injury otherwise.  Overall suspect may be mild gastritis.  No concern for appendicitis.  Tenderness is mostly in the epigastric and right upper quadrant.  Repeat abdominal exam is stable.  There is no tenderness in the right lower quadrant.  There is no fever or white count.  Very low suspicion for appendicitis or other intra-abdominal process.  Will prescribe Protonix.  Patient understands return precautions and discharged in ED in good condition.  This chart was dictated using voice recognition software.  Despite  best efforts to proofread,  errors can occur which can change the documentation meaning.   Final Clinical Impression(s) / ED Diagnoses Final diagnoses:  Pain  Epigastric pain    Rx / DC Orders ED Discharge Orders          Ordered    pantoprazole (PROTONIX) 20 MG tablet  Daily        01/31/21 2027             Virgina Norfolk, DO 01/31/21 2029

## 2021-01-31 NOTE — Discharge Instructions (Addendum)
Overall suspect that you might have some mild inflammation of your stomach.  Work-up today was otherwise unremarkable.  [facility].  Take Protonix as prescribed.

## 2021-02-11 ENCOUNTER — Encounter (HOSPITAL_COMMUNITY): Payer: Self-pay | Admitting: Emergency Medicine

## 2021-02-11 ENCOUNTER — Other Ambulatory Visit: Payer: Self-pay

## 2021-02-11 ENCOUNTER — Emergency Department (HOSPITAL_COMMUNITY): Payer: No Typology Code available for payment source

## 2021-02-11 ENCOUNTER — Emergency Department (HOSPITAL_COMMUNITY)
Admission: EM | Admit: 2021-02-11 | Discharge: 2021-02-12 | Disposition: A | Discharge status: Home / Self Care | Payer: No Typology Code available for payment source | Attending: Emergency Medicine | Admitting: Emergency Medicine

## 2021-02-11 DIAGNOSIS — T1490XA Injury, unspecified, initial encounter: Secondary | ICD-10-CM

## 2021-02-11 DIAGNOSIS — S8012XA Contusion of left lower leg, initial encounter: Secondary | ICD-10-CM

## 2021-02-11 DIAGNOSIS — S7012XA Contusion of left thigh, initial encounter: Secondary | ICD-10-CM

## 2021-02-11 DIAGNOSIS — T148XXA Other injury of unspecified body region, initial encounter: Secondary | ICD-10-CM

## 2021-02-11 DIAGNOSIS — S060X1A Concussion with loss of consciousness of 30 minutes or less, initial encounter: Secondary | ICD-10-CM

## 2021-02-11 DIAGNOSIS — Y92410 Unspecified street and highway as the place of occurrence of the external cause: Secondary | ICD-10-CM | POA: Insufficient documentation

## 2021-02-11 DIAGNOSIS — Z23 Encounter for immunization: Secondary | ICD-10-CM | POA: Insufficient documentation

## 2021-02-11 DIAGNOSIS — S299XXA Unspecified injury of thorax, initial encounter: Secondary | ICD-10-CM | POA: Diagnosis not present

## 2021-02-11 DIAGNOSIS — S3991XA Unspecified injury of abdomen, initial encounter: Secondary | ICD-10-CM | POA: Diagnosis not present

## 2021-02-11 DIAGNOSIS — Z20822 Contact with and (suspected) exposure to covid-19: Secondary | ICD-10-CM | POA: Diagnosis not present

## 2021-02-11 DIAGNOSIS — Y900 Blood alcohol level of less than 20 mg/100 ml: Secondary | ICD-10-CM | POA: Diagnosis not present

## 2021-02-11 DIAGNOSIS — Z79899 Other long term (current) drug therapy: Secondary | ICD-10-CM | POA: Insufficient documentation

## 2021-02-11 DIAGNOSIS — S0990XA Unspecified injury of head, initial encounter: Secondary | ICD-10-CM | POA: Diagnosis present

## 2021-02-11 LAB — I-STAT CHEM 8, ED
BUN: 12 mg/dL (ref 6–20)
Calcium, Ion: 0.95 mmol/L — ABNORMAL LOW (ref 1.15–1.40)
Chloride: 106 mmol/L (ref 98–111)
Creatinine, Ser: 1 mg/dL (ref 0.61–1.24)
Glucose, Bld: 91 mg/dL (ref 70–99)
HCT: 50 % (ref 39.0–52.0)
Hemoglobin: 17 g/dL (ref 13.0–17.0)
Potassium: 5.7 mmol/L — ABNORMAL HIGH (ref 3.5–5.1)
Sodium: 138 mmol/L (ref 135–145)
TCO2: 24 mmol/L (ref 22–32)

## 2021-02-11 LAB — COMPREHENSIVE METABOLIC PANEL
ALT: 16 U/L (ref 0–44)
AST: 20 U/L (ref 15–41)
Albumin: 3.7 g/dL (ref 3.5–5.0)
Alkaline Phosphatase: 48 U/L (ref 38–126)
Anion gap: 7 (ref 5–15)
BUN: 8 mg/dL (ref 6–20)
CO2: 24 mmol/L (ref 22–32)
Calcium: 8.4 mg/dL — ABNORMAL LOW (ref 8.9–10.3)
Chloride: 105 mmol/L (ref 98–111)
Creatinine, Ser: 0.97 mg/dL (ref 0.61–1.24)
GFR, Estimated: >60 mL/min (ref >60)
Glucose, Bld: 91 mg/dL (ref 70–99)
Potassium: 3.7 mmol/L (ref 3.5–5.1)
Sodium: 136 mmol/L (ref 135–145)
Total Bilirubin: 1 mg/dL (ref 0.3–1.2)
Total Protein: 6 g/dL — ABNORMAL LOW (ref 6.5–8.1)

## 2021-02-11 LAB — URINALYSIS, ROUTINE W REFLEX MICROSCOPIC
Bilirubin Urine: NEGATIVE
Glucose, UA: NEGATIVE mg/dL
Hgb urine dipstick: NEGATIVE
Ketones, ur: NEGATIVE mg/dL
Leukocytes,Ua: NEGATIVE
Nitrite: NEGATIVE
Protein, ur: NEGATIVE mg/dL
Specific Gravity, Urine: 1.04 — ABNORMAL HIGH (ref 1.005–1.030)
pH: 8 (ref 5.0–8.0)

## 2021-02-11 LAB — RESP PANEL BY RT-PCR (FLU A&B, COVID) ARPGX2
Influenza A by PCR: NEGATIVE
Influenza B by PCR: NEGATIVE
SARS Coronavirus 2 by RT PCR: NEGATIVE

## 2021-02-11 LAB — ETHANOL: Alcohol, Ethyl (B): <10 mg/dL (ref <10)

## 2021-02-11 LAB — CBC
HCT: 49.9 % (ref 39.0–52.0)
Hemoglobin: 16.9 g/dL (ref 13.0–17.0)
MCH: 30.2 pg (ref 26.0–34.0)
MCHC: 33.9 g/dL (ref 30.0–36.0)
MCV: 89.1 fL (ref 80.0–100.0)
Platelets: 237 10*3/uL (ref 150–400)
RBC: 5.6 MIL/uL (ref 4.22–5.81)
RDW: 13.8 % (ref 11.5–15.5)
WBC: 4.5 10*3/uL (ref 4.0–10.5)
nRBC: 0 % (ref 0.0–0.2)

## 2021-02-11 LAB — LACTIC ACID, PLASMA: Lactic Acid, Venous: 2.9 mmol/L (ref 0.5–1.9)

## 2021-02-11 LAB — SAMPLE TO BLOOD BANK

## 2021-02-11 MED ORDER — LORAZEPAM 2 MG/ML IJ SOLN
INTRAMUSCULAR | Status: AC
  Administered 2021-02-11: 1 mg via INTRAVENOUS
  Filled 2021-02-11: qty 1

## 2021-02-11 MED ORDER — LORAZEPAM 2 MG/ML IJ SOLN: 1.0000 mg | Freq: Once | INTRAMUSCULAR | Status: AC

## 2021-02-11 MED ORDER — KETOROLAC TROMETHAMINE 15 MG/ML IJ SOLN
15.0000 mg | Freq: Once | INTRAMUSCULAR | Status: AC
  Administered 2021-02-11: 15 mg via INTRAVENOUS
  Filled 2021-02-11: qty 1

## 2021-02-11 MED ORDER — IOHEXOL 350 MG/ML SOLN
100.0000 mL | Freq: Once | INTRAVENOUS | Status: AC | PRN
  Administered 2021-02-11: 100 mL via INTRAVENOUS

## 2021-02-11 MED ORDER — TETANUS-DIPHTH-ACELL PERTUSSIS 5-2.5-18.5 LF-MCG/0.5 IM SUSY
0.5000 mL | PREFILLED_SYRINGE | Freq: Once | INTRAMUSCULAR | Status: AC
  Administered 2021-02-11: 0.5 mL via INTRAMUSCULAR
  Filled 2021-02-11: qty 0.5

## 2021-02-11 MED ORDER — FENTANYL CITRATE PF 50 MCG/ML IJ SOSY
100.0000 ug | PREFILLED_SYRINGE | Freq: Once | INTRAMUSCULAR | Status: AC
  Administered 2021-02-11: 100 ug via INTRAVENOUS

## 2021-02-11 MED ORDER — SODIUM CHLORIDE 0.9 % IV BOLUS
500.0000 mL | Freq: Once | INTRAVENOUS | Status: AC
  Administered 2021-02-11: 500 mL via INTRAVENOUS

## 2021-02-11 NOTE — ED Notes (Signed)
MD notified of elevated lactic 

## 2021-02-11 NOTE — ED Notes (Signed)
Pt to Xray.

## 2021-02-11 NOTE — ED Notes (Signed)
Pt ambulating in hallway.

## 2021-02-11 NOTE — ED Notes (Signed)
X-ray at bedside
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
## 2021-02-11 NOTE — ED Notes (Signed)
Patient transported to CT w/ RN. 
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
## 2021-02-11 NOTE — ED Notes (Addendum)
Pt requesting food and drink.  Given per MD.

## 2021-02-11 NOTE — Discharge Instructions (Addendum)
Use ice, 1000mg  Tylenol every 4 hours and 600 mg ibuprofen every 6 hours as needed for pain. Follow-up close with primary doctor or trauma clinic for reassessment. Keep wounds clean and dry watch for signs of infection.

## 2021-02-11 NOTE — ED Notes (Signed)
Pt to CT

## 2021-02-11 NOTE — Progress Notes (Signed)
Orthopedic Tech Progress Note Patient Details:  Thomas Roy 24-Nov-2000 [id number]  Level 2 trauma   Patient ID: Thomas Roy, male   DOB: [birth year], 20 y.o.   MRN: [id number]  Thomas Roy 02/11/2021, 6:59 PM

## 2021-02-12 ENCOUNTER — Encounter (HOSPITAL_BASED_OUTPATIENT_CLINIC_OR_DEPARTMENT_OTHER): Payer: Self-pay | Admitting: *Deleted

## 2021-02-12 NOTE — ED Notes (Addendum)
Called into room d/t pt eyes rolling in the back of his head and whole-body shaking.  Pt responding to painful stimuli and voice.  MD at bedside.

## 2021-02-14 ENCOUNTER — Encounter (HOSPITAL_COMMUNITY): Payer: Self-pay | Admitting: Emergency Medicine

## 2021-02-14 ENCOUNTER — Emergency Department (HOSPITAL_COMMUNITY)
Admission: EM | Admit: 2021-02-14 | Discharge: 2021-02-14 | Disposition: A | Discharge status: Home / Self Care | Payer: Self-pay | Attending: Emergency Medicine | Admitting: Emergency Medicine

## 2021-02-14 ENCOUNTER — Emergency Department (HOSPITAL_COMMUNITY): Payer: Self-pay

## 2021-02-14 ENCOUNTER — Other Ambulatory Visit: Payer: Self-pay

## 2021-02-14 DIAGNOSIS — R569 Unspecified convulsions: Secondary | ICD-10-CM | POA: Insufficient documentation

## 2021-02-14 LAB — COMPREHENSIVE METABOLIC PANEL
ALT: 17 U/L (ref 0–44)
AST: 25 U/L (ref 15–41)
Albumin: 3.7 g/dL (ref 3.5–5.0)
Alkaline Phosphatase: 53 U/L (ref 38–126)
Anion gap: 8 (ref 5–15)
BUN: 9 mg/dL (ref 6–20)
CO2: 27 mmol/L (ref 22–32)
Calcium: 8.6 mg/dL — ABNORMAL LOW (ref 8.9–10.3)
Chloride: 104 mmol/L (ref 98–111)
Creatinine, Ser: 1.04 mg/dL (ref 0.61–1.24)
GFR, Estimated: >60 mL/min (ref >60)
Glucose, Bld: 102 mg/dL — ABNORMAL HIGH (ref 70–99)
Potassium: 3.9 mmol/L (ref 3.5–5.1)
Sodium: 139 mmol/L (ref 135–145)
Total Bilirubin: 1 mg/dL (ref 0.3–1.2)
Total Protein: 6.2 g/dL — ABNORMAL LOW (ref 6.5–8.1)

## 2021-02-14 LAB — CBC WITH DIFFERENTIAL/PLATELET
Abs Immature Granulocytes: 0.02 10*3/uL (ref 0.00–0.07)
Basophils Absolute: 0 10*3/uL (ref 0.0–0.1)
Basophils Relative: 1 %
Eosinophils Absolute: 0.2 10*3/uL (ref 0.0–0.5)
Eosinophils Relative: 3 %
HCT: 42.8 % (ref 39.0–52.0)
Hemoglobin: 14.2 g/dL (ref 13.0–17.0)
Immature Granulocytes: 0 %
Lymphocytes Relative: 29 %
Lymphs Abs: 1.8 10*3/uL (ref 0.7–4.0)
MCH: 29.9 pg (ref 26.0–34.0)
MCHC: 33.2 g/dL (ref 30.0–36.0)
MCV: 90.1 fL (ref 80.0–100.0)
Monocytes Absolute: 0.7 10*3/uL (ref 0.1–1.0)
Monocytes Relative: 10 %
Neutro Abs: 3.7 10*3/uL (ref 1.7–7.7)
Neutrophils Relative %: 57 %
Platelets: 197 10*3/uL (ref 150–400)
RBC: 4.75 MIL/uL (ref 4.22–5.81)
RDW: 13.6 % (ref 11.5–15.5)
WBC: 6.4 10*3/uL (ref 4.0–10.5)
nRBC: 0 % (ref 0.0–0.2)

## 2021-02-14 LAB — MAGNESIUM: Magnesium: 2.2 mg/dL (ref 1.7–2.4)

## 2021-02-14 MED ORDER — LEVETIRACETAM 500 MG PO TABS: 500.0000 mg | ORAL_TABLET | Freq: Two times a day (BID) | ORAL | 0 refills | Status: AC

## 2021-02-14 MED ORDER — MUPIROCIN CALCIUM 2 % EX CREA: 1.0000 | TOPICAL_CREAM | Freq: Two times a day (BID) | CUTANEOUS | 0 refills | Status: AC

## 2021-02-14 MED ORDER — NALOXONE HCL 2 MG/2ML IJ SOSY
PREFILLED_SYRINGE | INTRAMUSCULAR | Status: AC
  Administered 2021-02-14: 2 mg
  Filled 2021-02-14: qty 2

## 2021-02-14 MED ORDER — SODIUM CHLORIDE 0.9 % IV SOLN
2000.0000 mg | Freq: Once | INTRAVENOUS | Status: AC
  Administered 2021-02-14: 2000 mg via INTRAVENOUS
  Filled 2021-02-14: qty 20

## 2021-02-14 NOTE — ED Notes (Signed)
Pt is now A&Ox4 

## 2021-02-14 NOTE — ED Notes (Signed)
Mom [phone] wants an update immediately

## 2021-02-14 NOTE — ED Notes (Signed)
Pt slow to arouse and will only open eyes with painful stimuli. MD Palumbo ordered 2mg  Narcan

## 2021-02-14 NOTE — Discharge Instructions (Addendum)
Absolutely no driving for six months, take Keppra for your seizures and schedule a follow up with the neurologist listed above !!!!

## 2021-02-14 NOTE — ED Notes (Signed)
Patient transported to CT 

## 2021-02-14 NOTE — ED Triage Notes (Signed)
Pt presents to ED BIB [facility]. Pt c/o "seizures" tonight. Per EMS pt was A&O x4 immediately after seizure, no incontinence, no oral trauma. Hx of seizure but has not had one since middle school. Did admit to taking a few shots of alcohol this evening.   CBG - 93 HR - 62 RR - 16 116/70
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
## 2021-05-31 ENCOUNTER — Encounter (HOSPITAL_COMMUNITY): Payer: Self-pay

## 2021-05-31 ENCOUNTER — Emergency Department (HOSPITAL_COMMUNITY): Payer: Self-pay

## 2021-05-31 ENCOUNTER — Emergency Department (HOSPITAL_COMMUNITY)
Admission: EM | Admit: 2021-05-31 | Discharge: 2021-06-01 | Disposition: A | Discharge status: Home / Self Care | Payer: Self-pay | Attending: Emergency Medicine | Admitting: Emergency Medicine

## 2021-05-31 DIAGNOSIS — Y9389 Activity, other specified: Secondary | ICD-10-CM | POA: Insufficient documentation

## 2021-05-31 DIAGNOSIS — S0990XA Unspecified injury of head, initial encounter: Secondary | ICD-10-CM

## 2021-05-31 DIAGNOSIS — S0083XA Contusion of other part of head, initial encounter: Secondary | ICD-10-CM | POA: Insufficient documentation

## 2021-05-31 DIAGNOSIS — Z23 Encounter for immunization: Secondary | ICD-10-CM | POA: Insufficient documentation
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
## 2021-05-31 NOTE — ED Notes (Signed)
Patient transported to CT 

## 2021-06-01 LAB — COMPREHENSIVE METABOLIC PANEL
ALT: 11 U/L (ref 0–44)
AST: 27 U/L (ref 15–41)
Albumin: 3.6 g/dL (ref 3.5–5.0)
Alkaline Phosphatase: 55 U/L (ref 38–126)
Anion gap: 9 (ref 5–15)
BUN: 14 mg/dL (ref 6–20)
CO2: 22 mmol/L (ref 22–32)
Calcium: 8.7 mg/dL — ABNORMAL LOW (ref 8.9–10.3)
Chloride: 105 mmol/L (ref 98–111)
Creatinine, Ser: 1.01 mg/dL (ref 0.61–1.24)
GFR, Estimated: >60 mL/min (ref >60)
Glucose, Bld: 121 mg/dL — ABNORMAL HIGH (ref 70–99)
Potassium: 4.6 mmol/L (ref 3.5–5.1)
Sodium: 136 mmol/L (ref 135–145)
Total Bilirubin: 0.8 mg/dL (ref 0.3–1.2)
Total Protein: 6 g/dL — ABNORMAL LOW (ref 6.5–8.1)

## 2021-06-01 LAB — CBC WITH DIFFERENTIAL/PLATELET
Abs Immature Granulocytes: 0.03 10*3/uL (ref 0.00–0.07)
Basophils Absolute: 0 10*3/uL (ref 0.0–0.1)
Basophils Relative: 0 %
Eosinophils Absolute: 0.1 10*3/uL (ref 0.0–0.5)
Eosinophils Relative: 1 %
HCT: 44.8 % (ref 39.0–52.0)
Hemoglobin: 15.2 g/dL (ref 13.0–17.0)
Immature Granulocytes: 0 %
Lymphocytes Relative: 20 %
Lymphs Abs: 2.1 10*3/uL (ref 0.7–4.0)
MCH: 30 pg (ref 26.0–34.0)
MCHC: 33.9 g/dL (ref 30.0–36.0)
MCV: 88.5 fL (ref 80.0–100.0)
Monocytes Absolute: 0.8 10*3/uL (ref 0.1–1.0)
Monocytes Relative: 8 %
Neutro Abs: 7.1 10*3/uL (ref 1.7–7.7)
Neutrophils Relative %: 71 %
Platelets: 214 10*3/uL (ref 150–400)
RBC: 5.06 MIL/uL (ref 4.22–5.81)
RDW: 13.7 % (ref 11.5–15.5)
WBC: 10.1 10*3/uL (ref 4.0–10.5)
nRBC: 0 % (ref 0.0–0.2)

## 2021-06-01 LAB — ETHANOL: Alcohol, Ethyl (B): <10 mg/dL (ref <10)

## 2021-06-01 MED ORDER — TETANUS-DIPHTH-ACELL PERTUSSIS 5-2.5-18.5 LF-MCG/0.5 IM SUSY
0.5000 mL | PREFILLED_SYRINGE | Freq: Once | INTRAMUSCULAR | Status: AC
  Administered 2021-06-01: 0.5 mL via INTRAMUSCULAR
  Filled 2021-06-01: qty 0.5

## 2021-06-01 MED ORDER — ACETAMINOPHEN 500 MG PO TABS
1000.0000 mg | ORAL_TABLET | Freq: Once | ORAL | Status: AC
  Administered 2021-06-01: 1000 mg via ORAL
  Filled 2021-06-01: qty 2

## 2021-06-01 NOTE — ED Notes (Signed)
This RN offered to provide phone number(s) of cab service(s) for pt's ride home. Pt declined. Pt currently on his cell phone to find a ride home.

## 2021-06-01 NOTE — ED Notes (Signed)
Pt given ice water, provided with urinal, and reminded of need for urine sample

## 2021-06-01 NOTE — Discharge Instructions (Signed)
CT head and neck did not show any internal injuries. You may have mild concussion.  Expect some headaches for the next few days which is normal. Make sure to get adequate rest, limit TV, cell phone, computer use as this can worsen headaches. Return here for new concerns.
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
## 2021-06-01 NOTE — ED Notes (Signed)
E-signature pad unavailable at time of pt discharge. This RN discussed discharge materials with pt and answered all pt questions. Pt stated understanding of discharge material. ? ?

## 2021-06-01 NOTE — ED Notes (Signed)
Pt requesting medication for his head pain. EDP made aware of request

## 2022-06-14 ENCOUNTER — Emergency Department (HOSPITAL_BASED_OUTPATIENT_CLINIC_OR_DEPARTMENT_OTHER)
Admission: EM | Admit: 2022-06-14 | Discharge: 2022-06-14 | Disposition: A | Discharge status: Home / Self Care | Payer: Self-pay | Attending: Emergency Medicine | Admitting: Emergency Medicine

## 2022-06-14 ENCOUNTER — Other Ambulatory Visit: Payer: Self-pay

## 2022-06-14 ENCOUNTER — Encounter (HOSPITAL_BASED_OUTPATIENT_CLINIC_OR_DEPARTMENT_OTHER): Payer: Self-pay | Admitting: Emergency Medicine

## 2022-06-14 DIAGNOSIS — J101 Influenza due to other identified influenza virus with other respiratory manifestations: Secondary | ICD-10-CM | POA: Insufficient documentation

## 2022-06-14 DIAGNOSIS — Z20822 Contact with and (suspected) exposure to covid-19: Secondary | ICD-10-CM | POA: Insufficient documentation

## 2022-06-14 LAB — RESP PANEL BY RT-PCR (RSV, FLU A&B, COVID)  RVPGX2
Influenza A by PCR: NEGATIVE
Influenza B by PCR: POSITIVE — AB
Resp Syncytial Virus by PCR: NEGATIVE
SARS Coronavirus 2 by RT PCR: NEGATIVE

## 2022-06-14 NOTE — Discharge Instructions (Signed)
Your workup today showed you have influenza.  Continue taking Tylenol Motrin as you need to fever, or muscle aches.  Continue to drink plenty of fluids.  For any concerning symptoms return to the emergency room.

## 2022-06-14 NOTE — ED Provider Notes (Signed)
[facility]ERGENCY DEPARTMENT Provider Note   CSN: [id number] Arrival date & time: 06/14/22  1216     History  Chief Complaint  Patient presents with   Generalized Body Aches    [facility] is a 21 y.o. male.  21 year old male presents today for evaluation of URI symptoms and bodyaches for the past 2 to 3 days.  Denies fever.  Denies difficulty with p.o. intake.  Shortness of breath, or chest pain.  The history is provided by the patient. No language interpreter was used.       Home Medications Prior to Admission medications   Medication Sig Start Date End Date Taking? Authorizing Provider  benzonatate (TESSALON) 200 MG capsule Take 1 capsule (200 mg total) by mouth 3 (three) times daily as needed for cough. 03/22/20   Orpah Greek, MD  levETIRAcetam (KEPPRA) 500 MG tablet Take 1 tablet (500 mg total) by mouth 2 (two) times daily. 02/14/21   Palumbo, April, MD  mupirocin cream (BACTROBAN) 2 % Apply 1 application topically 2 (two) times daily. 02/14/21   Palumbo, April, MD  naproxen (NAPROSYN) 500 MG tablet Take 1 tablet twice daily as needed for fever, body aches or headache. 12/05/18   Molpus, John, MD  pantoprazole (PROTONIX) 20 MG tablet Take 1 tablet (20 mg total) by mouth daily for 14 days. 01/31/21 02/14/21  Curatolo, Adam, DO  predniSONE (DELTASONE) 20 MG tablet Take 2 tablets (40 mg total) by mouth daily with breakfast. 03/22/20   Pollina, Gwenyth Allegra, MD      Allergies    Coconut (cocos nucifera)    Review of Systems   Review of Systems  Constitutional:  Negative for fever.  HENT:  Positive for congestion. Negative for sore throat and trouble swallowing.   Respiratory:  Positive for cough. Negative for shortness of breath.   Cardiovascular:  Negative for chest pain.  Gastrointestinal:  Negative for abdominal pain and vomiting.  Neurological:  Negative for light-headedness and headaches.  All other systems reviewed and are  negative.   Physical Exam Updated Vital Signs BP 116/71 (BP Location: Right Arm)   Pulse 86   Temp 98 F (36.7 C) (Oral)   Resp 16   Ht 6\' 3"  (1.905 m)   SpO2 99%   BMI 27.00 kg/m  Physical Exam Vitals and nursing note reviewed.  Constitutional:      General: He is not in acute distress.    Appearance: Normal appearance. He is not ill-appearing.  HENT:     Head: Normocephalic and atraumatic.     Nose: Nose normal.  Eyes:     General: No scleral icterus.    Extraocular Movements: Extraocular movements intact.     Conjunctiva/sclera: Conjunctivae normal.  Cardiovascular:     Rate and Rhythm: Normal rate and regular rhythm.     Pulses: Normal pulses.  Pulmonary:     Effort: Pulmonary effort is normal. No respiratory distress.     Breath sounds: Normal breath sounds. No wheezing or rales.  Abdominal:     General: There is no distension.     Tenderness: There is no abdominal tenderness.  Musculoskeletal:        General: Normal range of motion.     Cervical back: Normal range of motion.  Skin:    General: Skin is warm and dry.  Neurological:     General: No focal deficit present.     Mental Status: He is alert. Mental status is at baseline.  ED Results / Procedures / Treatments   Labs (all labs ordered are listed, but only abnormal results are displayed) Labs Reviewed  RESP PANEL BY RT-PCR (RSV, FLU A&B, COVID)  [id number] - Abnormal; Notable for the following components:      Result Value   Influenza B by PCR POSITIVE (*)    All other components within normal limits    EKG None  Radiology No results found.  Procedures Procedures    Medications Ordered in ED Medications - No data to display  ED Course/ Medical Decision Making/ A&P                           Medical Decision Making  21 year old male presents today for evaluation of URI symptoms ongoing for the past 2 to 3 days.  Respiratory panel positive for influenza.  Patient is overall  well-appearing.  He is tolerating p.o. intake.  He is afebrile.  Will start patient on Tamiflu.  Symptomatic management discussed.  Return precaution discussed.  Patient voices understanding and is in agreement with plan.   Final Clinical Impression(s) / ED Diagnoses Final diagnoses:  Influenza B    Rx / DC Orders ED Discharge Orders     None         Marita Kansas, PA-C 06/14/22 1505    Elayne Snare K, DO 06/14/22 1539

## 2022-06-14 NOTE — ED Triage Notes (Signed)
Pt w/ body aches x 2d

## 2022-06-14 NOTE — ED Notes (Signed)
Pt requested work note due to missing shift today at 1500hrs.

## 2022-06-14 NOTE — ED Notes (Signed)
Body aches for 3 days, 2 Tylenol PO two days, 1 percocet given by family yesterday AM. Drinking fluids. No n/v/d. Non productive cough, runny nose consistently.
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
## 2022-07-05 IMAGING — CT CT ABD-PELV W/ CM
2 of 5 series · 13 of 36 positions shown, 16 images · IV contrast (Omni 300)
Comparison: None.

CLINICAL DATA: Abdominal trauma.

EXAM:
CT HEAD WITHOUT CONTRAST
CT CERVICAL SPINE WITHOUT CONTRAST
CT CHEST, ABDOMEN AND PELVIS WITH CONTRAST
TECHNIQUE: Contiguous axial images were obtained from the base of the skull
through the vertex without intravenous contrast.

[Series 3: cap with 5mm st · axial · 0.89mm/px · z∈[-886,-326]mm · 10 of 138 slices shown, 13 images]
[im 13/138  mediastinal]
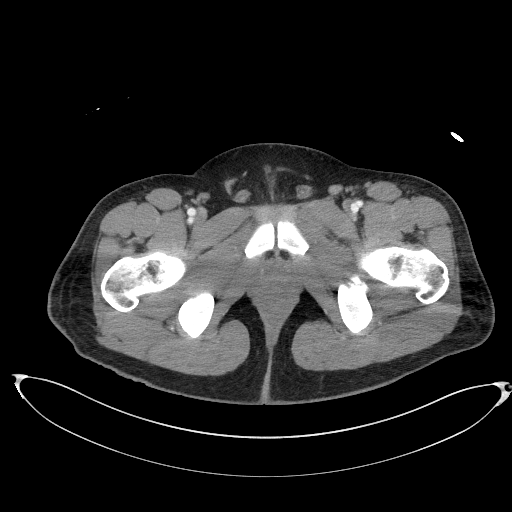
[im 13/138  lung]
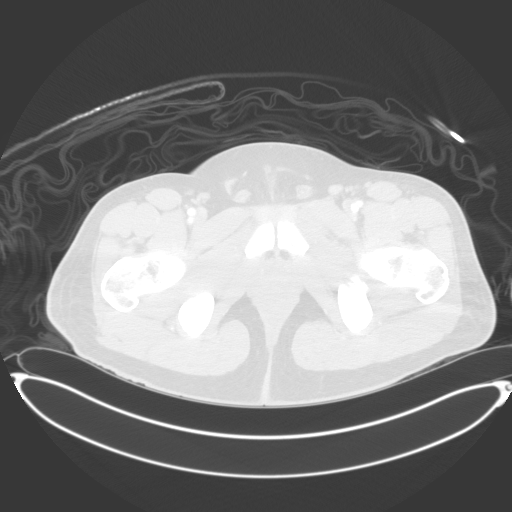
[im 25/138  lung]
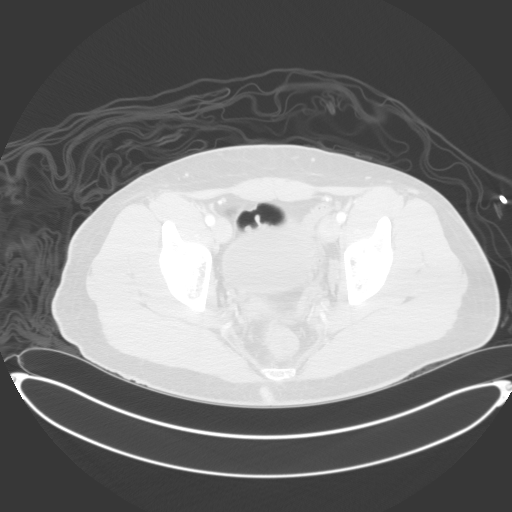
[im 38/138  lung]
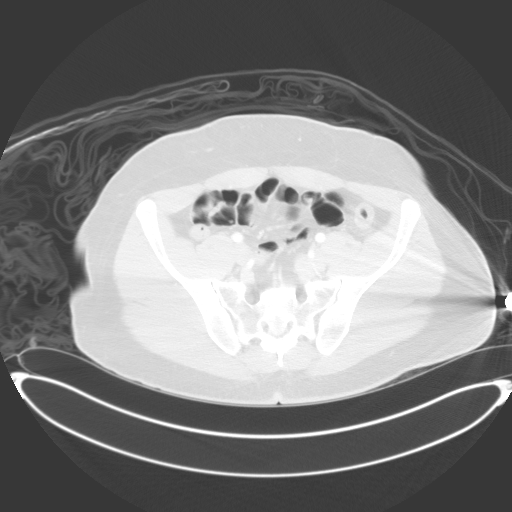
[im 50/138  lung]
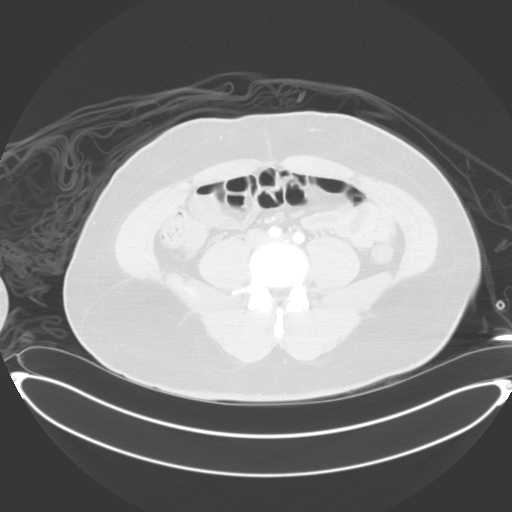
[im 63/138  mediastinal]
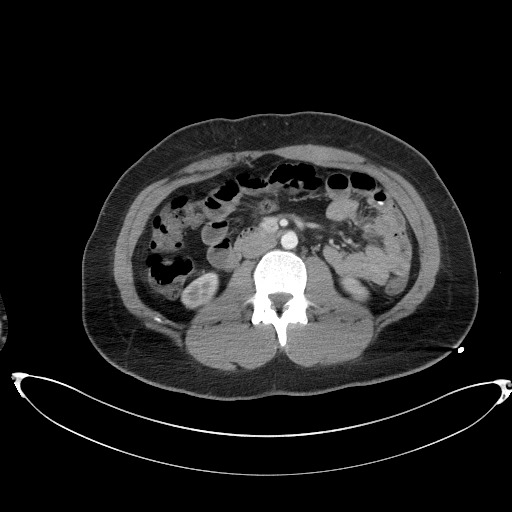
[im 63/138  lung]
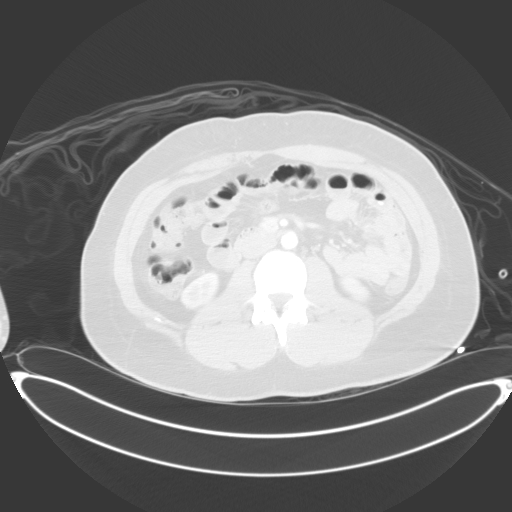
[im 75/138  lung]
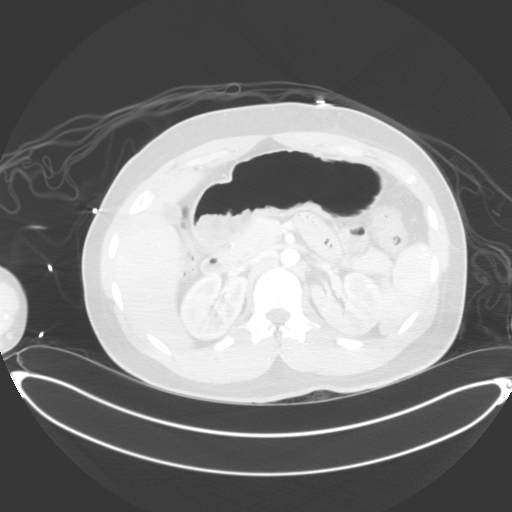
[im 88/138  lung]
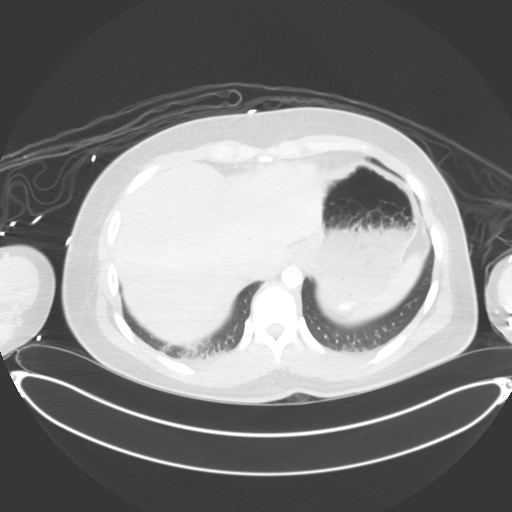
[im 100/138  lung]
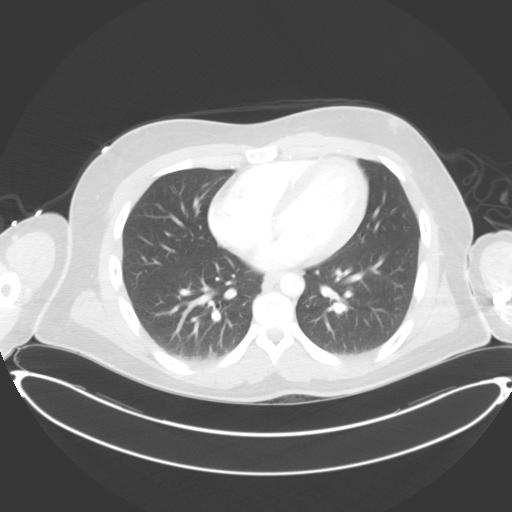
[im 113/138  mediastinal]
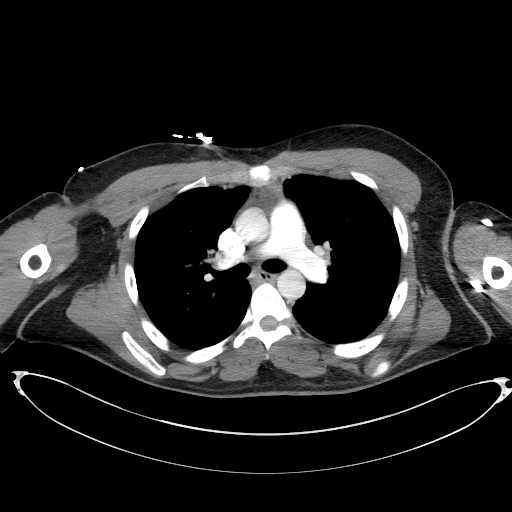
[im 113/138  lung]
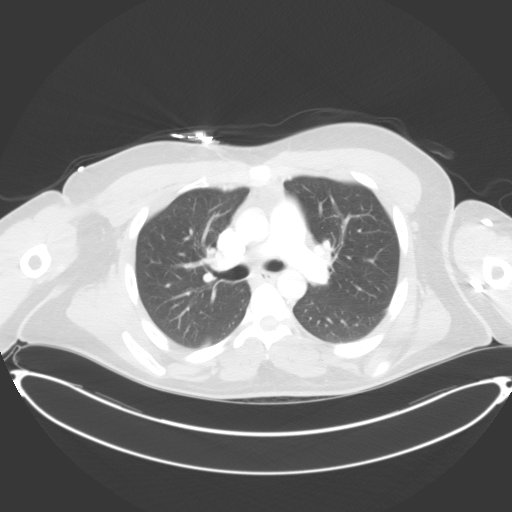
[im 125/138  lung]
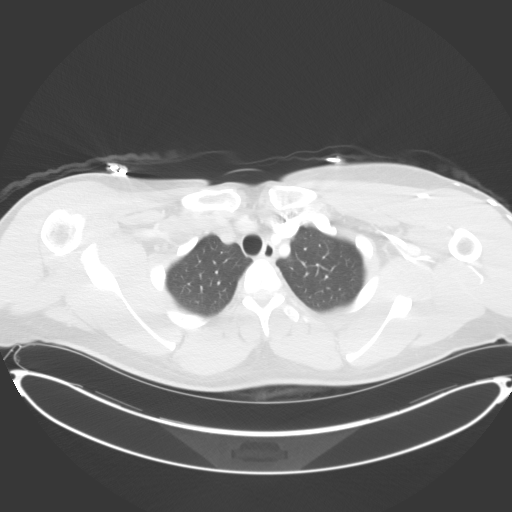

[Series 6: cap with 3mm st cor · coronal · 0.91mm/px · 3 of 124 slices shown]
[im 25/124  lung]
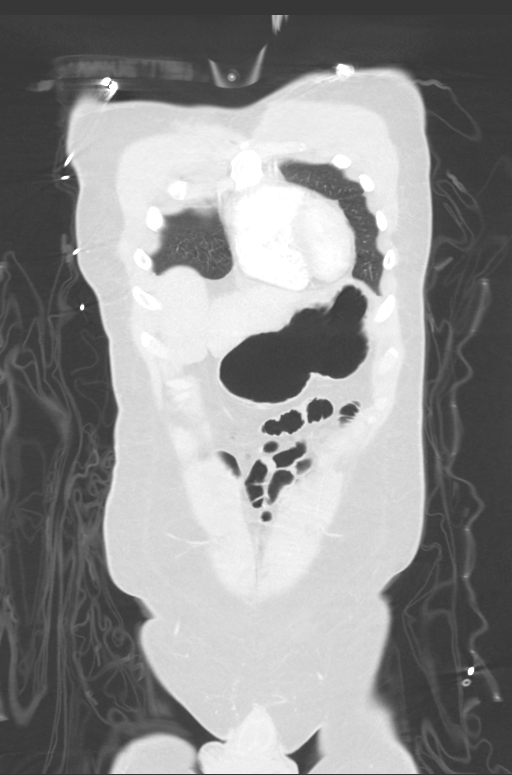
[im 50/124  lung]
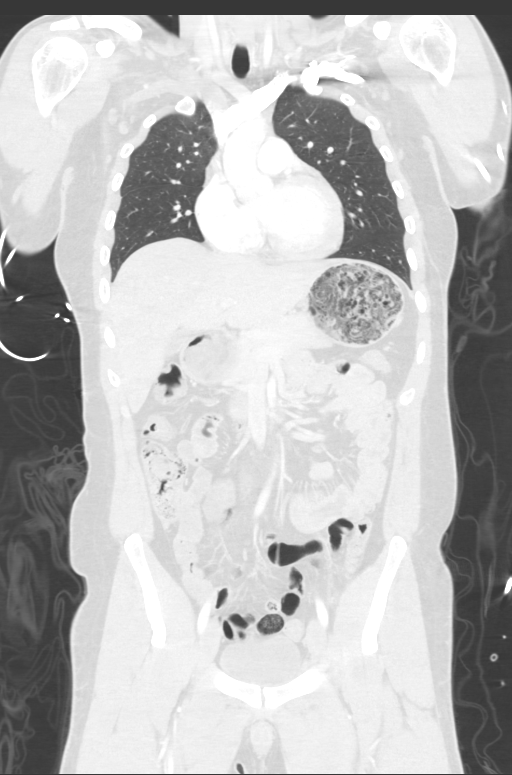
[im 74/124  lung]
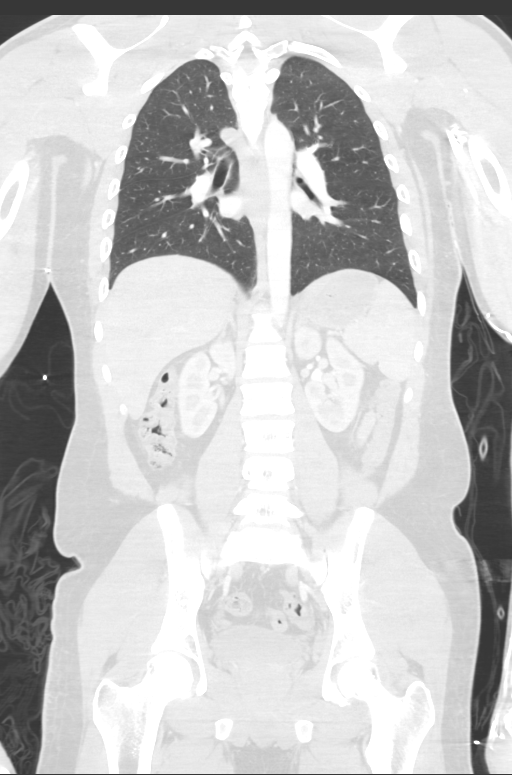

[13 of 36 positions shown; findings below may reference images not displayed]

Multidetector CT imaging of the cervical spine was performed without
intravenous contrast. Multiplanar CT image reconstructions were also
generated.

Multidetector CT imaging of the chest, abdomen and pelvis was
performed following the standard protocol during bolus
administration of intravenous contrast.

CONTRAST:  100mL OMNIPAQUE IOHEXOL 350 MG/ML SOLN
FINDINGS: CT HEAD FINDINGS

Brain:

No evidence of large-territorial acute infarction. No parenchymal
hemorrhage. No mass lesion. No extra-axial collection.

No mass effect or midline shift. No hydrocephalus. Basilar cisterns
are patent.

Vascular: No hyperdense vessel.

Skull: No acute fracture or focal lesion.

Sinuses/Orbits: Paranasal sinuses and mastoid air cells are clear.
The orbits are unremarkable.

Other: Small subcutaneus soft tissue hematoma and edema along the
right frontal scalp.

CT CERVICAL FINDINGS

Alignment: Normal.

Skull base and vertebrae: No acute fracture. No aggressive appearing
focal osseous lesion or focal pathologic process.

Soft tissues and spinal canal: No prevertebral fluid or swelling. No
visible canal hematoma.

Upper chest: Unremarkable.

Other: None.

CHEST:
Ports and Devices: None.

Lungs/airways:

Bilateral lower lobe subsegmental atelectasis. No focal
consolidation. No pulmonary nodule. No pulmonary mass. No pulmonary
contusion or laceration. No pneumatocele formation.

The central airways are patent.

Pleura: No pleural effusion. No pneumothorax. No hemothorax.

Lymph Nodes: No mediastinal, hilar, or axillary lymphadenopathy.

Mediastinum:

No pneumomediastinum. No aortic injury or mediastinal hematoma.

The thoracic aorta is normal in caliber. The heart is normal in
size. No significant pericardial effusion.

The esophagus is unremarkable.

The thyroid is unremarkable.

Chest Wall / Breasts: No chest wall mass.

Musculoskeletal: No acute rib or sternal fracture. No spinal
fracture.

ABDOMEN / PELVIS:
Liver: Not enlarged. No focal lesion. No laceration or subcapsular
hematoma.

Biliary System: The gallbladder is otherwise unremarkable with no
radio-opaque gallstones. No biliary ductal dilatation.

Pancreas: Normal pancreatic contour. No main pancreatic duct
dilatation.

Spleen: Not enlarged. No focal lesion. No laceration, subcapsular
hematoma, or vascular injury.

Adrenal Glands: No nodularity bilaterally.

Kidneys:

Bilateral kidneys enhance symmetrically. No hydronephrosis. No
contusion, laceration, or subcapsular hematoma.

No injury to the vascular structures or collecting systems. No
hydroureter.

The urinary bladder is unremarkable.

Bowel: No small or large bowel wall thickening or dilatation. The
appendix is unremarkable.

Mesentery, Omentum, and Peritoneum: Nonspecific trace simple free
fluid within the pelvis. No pneumoperitoneum. No hemoperitoneum. No
mesenteric hematoma identified. No organized fluid collection.

Pelvic Organs: Normal.

Lymph Nodes: No abdominal, pelvic, inguinal lymphadenopathy.

Vasculature: No abdominal aorta or iliac aneurysm. No active
contrast extravasation or pseudoaneurysm.

Musculoskeletal:

No significant soft tissue hematoma.  Tiny paraumbilical hernia.

No acute pelvic fracture. No spinal fracture.
IMPRESSION: 1. No acute intracranial abnormality.
2. No acute displaced fracture or traumatic listhesis of the
cervical spine.
3.  No acute traumatic injury to the chest, abdomen, or pelvis.

4. No acute fracture or traumatic malalignment of the thoracic or
lumbar spine.

## 2023-02-10 ENCOUNTER — Other Ambulatory Visit: Payer: Self-pay

## 2023-02-10 ENCOUNTER — Encounter (HOSPITAL_BASED_OUTPATIENT_CLINIC_OR_DEPARTMENT_OTHER): Payer: Self-pay

## 2023-02-10 ENCOUNTER — Emergency Department (HOSPITAL_BASED_OUTPATIENT_CLINIC_OR_DEPARTMENT_OTHER)
Admission: EM | Admit: 2023-02-10 | Discharge: 2023-02-11 | Disposition: A | Discharge status: Home / Self Care | Payer: MEDICAID | Attending: Emergency Medicine | Admitting: Emergency Medicine

## 2023-02-10 DIAGNOSIS — R55 Syncope and collapse: Secondary | ICD-10-CM | POA: Insufficient documentation

## 2023-02-10 DIAGNOSIS — R61 Generalized hyperhidrosis: Secondary | ICD-10-CM | POA: Insufficient documentation

## 2023-02-10 DIAGNOSIS — E860 Dehydration: Secondary | ICD-10-CM | POA: Insufficient documentation

## 2023-02-10 HISTORY — DX: Unspecified convulsions: R56.9

## 2023-02-10 LAB — COMPREHENSIVE METABOLIC PANEL
ALT: 12 U/L (ref 0–44)
AST: 18 U/L (ref 15–41)
Albumin: 3.3 g/dL — ABNORMAL LOW (ref 3.5–5.0)
Alkaline Phosphatase: 34 U/L — ABNORMAL LOW (ref 38–126)
Anion gap: 9 (ref 5–15)
BUN: 12 mg/dL (ref 6–20)
CO2: 23 mmol/L (ref 22–32)
Calcium: 7.7 mg/dL — ABNORMAL LOW (ref 8.9–10.3)
Chloride: 105 mmol/L (ref 98–111)
Creatinine, Ser: 1.04 mg/dL (ref 0.61–1.24)
GFR, Estimated: >60 mL/min (ref >60)
Glucose, Bld: 175 mg/dL — ABNORMAL HIGH (ref 70–99)
Potassium: 3.2 mmol/L — ABNORMAL LOW (ref 3.5–5.1)
Sodium: 137 mmol/L (ref 135–145)
Total Bilirubin: 0.6 mg/dL (ref 0.3–1.2)
Total Protein: 5.4 g/dL — ABNORMAL LOW (ref 6.5–8.1)

## 2023-02-10 LAB — CBC WITH DIFFERENTIAL/PLATELET
Abs Immature Granulocytes: 0 10*3/uL (ref 0.00–0.07)
Basophils Absolute: 0 10*3/uL (ref 0.0–0.1)
Basophils Relative: 1 %
Eosinophils Absolute: 0.1 10*3/uL (ref 0.0–0.5)
Eosinophils Relative: 1 %
HCT: 47.2 % (ref 39.0–52.0)
Hemoglobin: 16.3 g/dL (ref 13.0–17.0)
Immature Granulocytes: 0 %
Lymphocytes Relative: 39 %
Lymphs Abs: 1.5 10*3/uL (ref 0.7–4.0)
MCH: 30.2 pg (ref 26.0–34.0)
MCHC: 34.5 g/dL (ref 30.0–36.0)
MCV: 87.6 fL (ref 80.0–100.0)
Monocytes Absolute: 0.2 10*3/uL (ref 0.1–1.0)
Monocytes Relative: 6 %
Neutro Abs: 2 10*3/uL (ref 1.7–7.7)
Neutrophils Relative %: 53 %
Platelets: 190 10*3/uL (ref 150–400)
RBC: 5.39 MIL/uL (ref 4.22–5.81)
RDW: 13 % (ref 11.5–15.5)
Smear Review: NORMAL
WBC: 3.9 10*3/uL — ABNORMAL LOW (ref 4.0–10.5)
nRBC: 0 % (ref 0.0–0.2)

## 2023-02-10 MED ORDER — CALCIUM GLUCONATE-NACL 1-0.675 GM/50ML-% IV SOLN
1.0000 g | Freq: Once | INTRAVENOUS | Status: AC
  Administered 2023-02-10: 1000 mg via INTRAVENOUS
  Filled 2023-02-10: qty 50

## 2023-02-10 MED ORDER — LACTATED RINGERS IV BOLUS
1000.0000 mL | Freq: Once | INTRAVENOUS | Status: AC
  Administered 2023-02-10: 1000 mL via INTRAVENOUS

## 2023-02-10 NOTE — ED Provider Notes (Signed)
[place] [facility]Y DEPARTMENT AT [facility] Provider Note   CSN: [id number] Arrival date & time: 02/10/23  2035     History  Chief Complaint  Patient presents with   syncopal episode    Thomas Roy is a 22 y.o. male.  HPI Patient with syncopal episode.  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  Did not drink much.  Then donated plasma.  Walked home and had a syncopal episode.  Denies seizure activity.  Hypotensive and somewhat pale upon arrival.  EMS gave 500 cc bolus.   Past Medical History:  Diagnosis Date   Seizures (HCC)     Home Medications Prior to Admission medications   Medication Sig Start Date End Date Taking? Authorizing Provider  benzonatate (TESSALON) 200 MG capsule Take 1 capsule (200 mg total) by mouth 3 (three) times daily as needed for cough. 03/22/20   Gilda Crease, MD  levETIRAcetam (KEPPRA) 500 MG tablet Take 1 tablet (500 mg total) by mouth 2 (two) times daily. 02/14/21   Palumbo, April, MD  mupirocin cream (BACTROBAN) 2 % Apply 1 application topically 2 (two) times daily. 02/14/21   Palumbo, April, MD  naproxen (NAPROSYN) 500 MG tablet Take 1 tablet twice daily as needed for fever, body aches or headache. 12/05/18   Molpus, John, MD  pantoprazole (PROTONIX) 20 MG tablet Take 1 tablet (20 mg total) by mouth daily for 14 days. 01/31/21 02/14/21  Curatolo, Adam, DO  predniSONE (DELTASONE) 20 MG tablet Take 2 tablets (40 mg total) by mouth daily with breakfast. 03/22/20   Pollina, Canary Brim, MD      Allergies    Coconut (cocos nucifera)    Review of Systems   Review of Systems  Physical Exam Updated Vital Signs BP (!) 106/55   Pulse (!) 50   Resp 16   Ht 6\' 4"  (1.93 m)   Wt 98 kg   SpO2 100%   BMI 26.29 kg/m  Physical Exam Vitals and nursing note reviewed.  Constitutional:      Appearance: He is diaphoretic.  Cardiovascular:     Rate and Rhythm: Regular rhythm.  Pulmonary:     Breath sounds: No wheezing  or rhonchi.  Abdominal:     Tenderness: There is no abdominal tenderness.  Musculoskeletal:        General: No tenderness.  Skin:    General: Skin is warm.  Neurological:     Mental Status: He is alert and oriented to person, place, and time.     ED Results / Procedures / Treatments   Labs (all labs ordered are listed, but only abnormal results are displayed) Labs Reviewed  CBC WITH DIFFERENTIAL/PLATELET - Abnormal; Notable for the following components:      Result Value   WBC 3.9 (*)    All other components within normal limits  COMPREHENSIVE METABOLIC PANEL - Abnormal; Notable for the following components:   Potassium 3.2 (*)    Glucose, Bld 175 (*)    Calcium 7.7 (*)    Total Protein 5.4 (*)    Albumin 3.3 (*)    Alkaline Phosphatase 34 (*)    All other components within normal limits    EKG EKG Interpretation Date/Time:  Tuesday February 10 2023 20:45:54 EDT Ventricular Rate:  73 PR Interval:  132 QRS Duration:  81 QT Interval:  354 QTC Calculation: 390 R Axis:   51  Text Interpretation: Sinus rhythm RSR' in V1 or V2, probably  normal variant Baseline wander in lead(s) V6 Confirmed by Benjiman Core [phone]) on 02/10/2023 9:23:02 PM  Radiology No results found.  Procedures Procedures    Medications Ordered in ED Medications  lactated ringers bolus 1,000 mL ( Intravenous Stopped 02/10/23 2249)  lactated ringers bolus 1,000 mL (1,000 mLs Intravenous New Bag/Given 02/10/23 2148)  calcium gluconate 1 g/ 50 mL sodium chloride IVPB (0 mg Intravenous Stopped 02/10/23 2239)    ED Course/ Medical Decision Making/ A&P                                 Medical Decision Making Amount and/or Complexity of Data Reviewed Labs: ordered.  Risk Prescription drug management.   Patient has syncope.  Began after working outside and then donating plasma.  Hypotensive upon arrival.  Blood work overall reassuring but does have a hypocalcemia and mildly low white count.  Will  supplement with lactated Ringer's and calcium.  Blood pressure improving.  Tolerating orals.  Feeling somewhat better but sleepy now.  Blood pressure is down to the 90s again.  Will recheck and if more normalized will discharge.  If still low will give another fluid bolus.        Final Clinical Impression(s) / ED Diagnoses Final diagnoses:  Syncope, unspecified syncope type  Dehydration  Hypocalcemia    Rx / DC Orders ED Discharge Orders     None         Benjiman Core, MD 02/10/23 2353

## 2023-02-10 NOTE — ED Triage Notes (Signed)
Pt brought in by EMS, witnessed syncopal episode. Worked all day and then donated plasma came home and witnessed syncopal episode as he was walking into his apt. Per ems, bs 146 500 cc NS bolus given Diaphoretic in triage

## 2023-02-10 NOTE — ED Notes (Signed)
Patient ambulatory to [facility] to provide a UA.

## 2023-02-11 MED ORDER — LACTATED RINGERS IV BOLUS
1000.0000 mL | Freq: Once | INTRAVENOUS | Status: AC
  Administered 2023-02-11: 1000 mL via INTRAVENOUS

## 2023-06-29 ENCOUNTER — Encounter (HOSPITAL_COMMUNITY): Payer: Self-pay

## 2023-06-29 ENCOUNTER — Emergency Department (HOSPITAL_COMMUNITY)
Admission: EM | Admit: 2023-06-29 | Discharge: 2023-06-29 | Disposition: A | Discharge status: Home / Self Care | Payer: MEDICAID

## 2023-06-29 ENCOUNTER — Other Ambulatory Visit: Payer: Self-pay

## 2023-06-29 DIAGNOSIS — Z202 Contact with and (suspected) exposure to infections with a predominantly sexual mode of transmission: Secondary | ICD-10-CM | POA: Insufficient documentation

## 2023-06-29 DIAGNOSIS — Z711 Person with feared health complaint in whom no diagnosis is made: Secondary | ICD-10-CM

## 2023-06-29 DIAGNOSIS — R300 Dysuria: Secondary | ICD-10-CM

## 2023-06-29 LAB — URINALYSIS, ROUTINE W REFLEX MICROSCOPIC
Bacteria, UA: NONE SEEN
Bilirubin Urine: NEGATIVE
Glucose, UA: NEGATIVE mg/dL
Ketones, ur: NEGATIVE mg/dL
Nitrite: NEGATIVE
Protein, ur: NEGATIVE mg/dL
Specific Gravity, Urine: 1.019 (ref 1.005–1.030)
pH: 6 (ref 5.0–8.0)

## 2023-06-29 MED ORDER — STERILE WATER FOR INJECTION IJ SOLN
INTRAMUSCULAR | Status: AC
  Filled 2023-06-29: qty 10

## 2023-06-29 MED ORDER — DOXYCYCLINE HYCLATE 100 MG PO CAPS: 100.0000 mg | ORAL_CAPSULE | Freq: Two times a day (BID) | ORAL | 0 refills | Status: AC

## 2023-06-29 MED ORDER — CEFTRIAXONE SODIUM 1 G IJ SOLR
500.0000 mg | Freq: Once | INTRAMUSCULAR | Status: AC
  Administered 2023-06-29: 500 mg via INTRAMUSCULAR
  Filled 2023-06-29: qty 10

## 2023-06-29 NOTE — ED Triage Notes (Signed)
 C/o burning with urination, penile pain, and hematuria that started today.  Unprotected sex 1 week ago.

## 2023-06-29 NOTE — Discharge Instructions (Addendum)
 It was a pleasure taking part in your care.  As we discussed, you will need to follow-up on the results of your testing done here today on MyChart.  Please utilize instructions provided below and sign up for MyChart.  If your testing is negative, you may discontinue antibiotics.  Please take doxycycline  twice a day for the next 7 days.  Please begin to use condoms in the future.  Return to the ED with any new or worsening signs or symptoms.

## 2023-06-29 NOTE — ED Provider Notes (Signed)
 [place] EMERGENCY DEPARTMENT AT [facility] Provider Note   CSN: [id number] Arrival date & time: 06/29/23  1520     History  Chief Complaint  Patient presents with   SEXUALLY TRANSMITTED DISEASE    Thomas Roy is a 23 y.o. male with medical history of seizures.  Patient presents to ED for evaluation of dysuria.  Patient reports that 5 days ago he had sexual contact with a new partner, denies a condom.  Reports that 2 days ago developed burning with urination as well as tenderness around his urethral meatus.  Denies any penile discharge, lower abdominal pain, fevers, nausea or vomiting.  States that he is unsure if his sexual partner is experiencing similar symptoms.  He is concerned for STI.  Denies any flank pain, fevers. Denies scrotal or testicular pain.  HPI     Home Medications Prior to Admission medications   Medication Sig Start Date End Date Taking? Authorizing Provider  doxycycline  (VIBRAMYCIN ) 100 MG capsule Take 1 capsule (100 mg total) by mouth 2 (two) times daily. 06/29/23  Yes Ruthell Lonni FALCON, PA-C  benzonatate  (TESSALON ) 200 MG capsule Take 1 capsule (200 mg total) by mouth 3 (three) times daily as needed for cough. 03/22/20   Haze Lonni PARAS, MD  levETIRAcetam  (KEPPRA ) 500 MG tablet Take 1 tablet (500 mg total) by mouth 2 (two) times daily. 02/14/21   Palumbo, April, MD  mupirocin  cream (BACTROBAN ) 2 % Apply 1 application topically 2 (two) times daily. 02/14/21   Palumbo, April, MD  naproxen  (NAPROSYN ) 500 MG tablet Take 1 tablet twice daily as needed for fever, body aches or headache. 12/05/18   Molpus, John, MD  pantoprazole  (PROTONIX ) 20 MG tablet Take 1 tablet (20 mg total) by mouth daily for 14 days. 01/31/21 02/14/21  Ruthe Cornet, DO  predniSONE  (DELTASONE ) 20 MG tablet Take 2 tablets (40 mg total) by mouth daily with breakfast. 03/22/20   Pollina, Lonni PARAS, MD      Allergies    Coconut (cocos nucifera)    Review of Systems    Review of Systems  Constitutional:  Negative for fever.  Gastrointestinal:  Negative for abdominal pain, nausea and vomiting.  Genitourinary:  Positive for dysuria. Negative for flank pain and penile discharge.  All other systems reviewed and are negative.   Physical Exam Updated Vital Signs BP (!) 150/82   Pulse 98   Temp 99.5 F (37.5 C) (Oral)   Resp 18   Ht 6' 4 (1.93 m)   Wt 98 kg   SpO2 99%   BMI 26.30 kg/m  Physical Exam Vitals and nursing note reviewed.  Constitutional:      General: He is not in acute distress.    Appearance: Normal appearance. He is not ill-appearing, toxic-appearing or diaphoretic.  HENT:     Head: Normocephalic and atraumatic.     Nose: Nose normal.     Mouth/Throat:     Mouth: Mucous membranes are moist.     Pharynx: Oropharynx is clear.  Eyes:     Extraocular Movements: Extraocular movements intact.     Conjunctiva/sclera: Conjunctivae normal.     Pupils: Pupils are equal, round, and reactive to light.  Cardiovascular:     Rate and Rhythm: Normal rate and regular rhythm.  Pulmonary:     Effort: Pulmonary effort is normal.     Breath sounds: Normal breath sounds. No wheezing.  Abdominal:     General: Abdomen is flat. Bowel sounds are normal. There  is no distension.     Palpations: Abdomen is soft.     Tenderness: There is no abdominal tenderness.  Genitourinary:    Comments: Defers GU examination Musculoskeletal:     Cervical back: Normal range of motion and neck supple. No tenderness.  Skin:    General: Skin is warm and dry.     Capillary Refill: Capillary refill takes less than 2 seconds.  Neurological:     Mental Status: He is alert and oriented to person, place, and time.     ED Results / Procedures / Treatments   Labs (all labs ordered are listed, but only abnormal results are displayed) Labs Reviewed  URINALYSIS, ROUTINE W REFLEX MICROSCOPIC - Abnormal; Notable for the following components:      Result Value   Hgb  urine dipstick SMALL (*)    Leukocytes,Ua TRACE (*)    All other components within normal limits  GC/CHLAMYDIA PROBE AMP ([facility]) NOT AT [facility]    EKG None  Radiology No results found.  Procedures Procedures   Medications Ordered in ED Medications  cefTRIAXone  (ROCEPHIN ) injection 500 mg (has no administration in time range)    ED Course/ Medical Decision Making/ A&P  Medical Decision Making Amount and/or Complexity of Data Reviewed Labs: ordered.   23 year old male presents for concern of STI.  Please see HPI for further details.  On examination the patient is afebrile and nontachycardic.  His lung sounds are clear bilaterally, not hypoxic.  Abdomen soft and compressible throughout.  Neurological examination is at baseline.  He defers on GU examination.  Urinalysis shows small hemoglobin, leukocytes.  GC chlamydia testing sent off.  Patient will receive 5 mg IM ceftriaxone  shot.  Will start patient on 7 days of doxycycline .  He was advised to follow-up on the results of testing done on MyChart.  Advised to begin using condoms.  Encouraged to return to the ED with any new or worsening signs or symptoms.  Stable to discharge home.   Final Clinical Impression(s) / ED Diagnoses Final diagnoses:  Dysuria  Concern about STD in male without diagnosis    Rx / DC Orders ED Discharge Orders          Ordered    doxycycline  (VIBRAMYCIN ) 100 MG capsule  2 times daily        06/29/23 1659              Ruthell Lonni FALCON, PA-C 06/29/23 1700    Neysa Caron PARAS, DO 06/29/23 2212

## 2023-06-30 LAB — GC/CHLAMYDIA PROBE AMP (~~LOC~~) NOT AT ARMC
Chlamydia: POSITIVE — AB
Comment: NEGATIVE
Comment: NORMAL
Neisseria Gonorrhea: NEGATIVE

## 2023-07-04 ENCOUNTER — Telehealth: Payer: MEDICAID
